# Patient Record
Sex: Female | Born: 1995
Health system: Southern US, Community
[De-identification: ages and names within clinical notes are randomized; demographics above are authoritative.]

## PROBLEM LIST (undated history)

## (undated) DIAGNOSIS — F32A Depression, unspecified: Secondary | ICD-10-CM

## (undated) DIAGNOSIS — F419 Anxiety disorder, unspecified: Secondary | ICD-10-CM

## (undated) HISTORY — DX: Depression, unspecified: F32.A

---
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
## 2017-01-26 IMAGING — NM NM HEPATOBILIARY IMAGE, INC GB
4 series · 14 of 14 positions shown · non-contrast
Comparison: Ultrasound 01/17/2016

CLINICAL DATA: Epigastric and right upper quadrant pain for 2 days.
Nausea, vomiting. Elevated white count and fever.

EXAM:
NUCLEAR MEDICINE HEPATOBILIARY IMAGING
TECHNIQUE: Sequential images of the abdomen were obtained [DATE] minutes
following intravenous administration of radiopharmaceutical.
RADIOPHARMACEUTICALS:  2.8 mCi Oc-88m  Choletec IV

[Series 1: biliary · 4.14mm/px · 6 of 60 frames shown]
[frame 6/60]
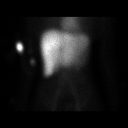
[frame 16/60]
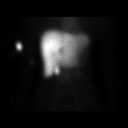
[frame 26/60]
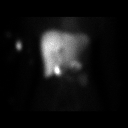
[frame 36/60]
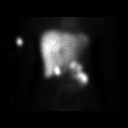
[frame 46/60]
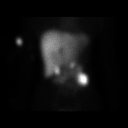
[frame 56/60]
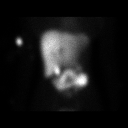

[Series 2: rt lat · 4.14mm/px · 1 of 1 slices shown (1 of 2)]
[im 1/1]
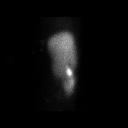

[Series 3: gbef · 4.14mm/px · 6 of 31 frames shown]
[frame 3/31]
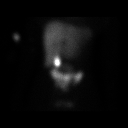
[frame 8/31]
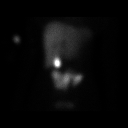
[frame 13/31]
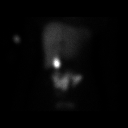
[frame 18/31]
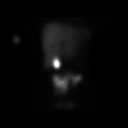
[frame 23/31]
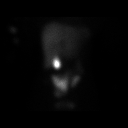
[frame 29/31]
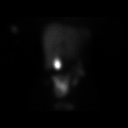

[Series 4: rt lat · 4.14mm/px · 1 of 1 slices shown (2 of 2)]
[im 1/1]
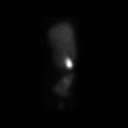

[14 of 14 positions shown; findings below may reference images not displayed]

FINDINGS: There is prompt uptake and excretion of radiotracer by the liver.
Gallbladder fills. No evidence of cystic duct or common bile duct
obstruction.
IMPRESSION: No evidence of biliary obstruction including no cystic duct
obstruction.

## 2017-02-14 DIAGNOSIS — F5001 Anorexia nervosa, restricting type: Secondary | ICD-10-CM | POA: Diagnosis not present

## 2017-02-25 DIAGNOSIS — F5001 Anorexia nervosa, restricting type: Secondary | ICD-10-CM | POA: Diagnosis not present

## 2017-03-04 DIAGNOSIS — F5001 Anorexia nervosa, restricting type: Secondary | ICD-10-CM | POA: Diagnosis not present

## 2017-03-11 DIAGNOSIS — F5001 Anorexia nervosa, restricting type: Secondary | ICD-10-CM | POA: Diagnosis not present

## 2017-03-18 DIAGNOSIS — F5001 Anorexia nervosa, restricting type: Secondary | ICD-10-CM | POA: Diagnosis not present

## 2017-03-25 DIAGNOSIS — F5001 Anorexia nervosa, restricting type: Secondary | ICD-10-CM | POA: Diagnosis not present

## 2017-04-01 DIAGNOSIS — F5001 Anorexia nervosa, restricting type: Secondary | ICD-10-CM | POA: Diagnosis not present

## 2017-04-29 DIAGNOSIS — F5001 Anorexia nervosa, restricting type: Secondary | ICD-10-CM | POA: Diagnosis not present

## 2017-05-06 DIAGNOSIS — Z01419 Encounter for gynecological examination (general) (routine) without abnormal findings: Secondary | ICD-10-CM | POA: Diagnosis not present

## 2017-05-06 DIAGNOSIS — Z681 Body mass index (BMI) 19 or less, adult: Secondary | ICD-10-CM | POA: Diagnosis not present

## 2017-05-19 DIAGNOSIS — F5001 Anorexia nervosa, restricting type: Secondary | ICD-10-CM | POA: Diagnosis not present

## 2017-05-27 ENCOUNTER — Ambulatory Visit: Payer: Self-pay | Admitting: Registered"

## 2017-06-03 DIAGNOSIS — F5001 Anorexia nervosa, restricting type: Secondary | ICD-10-CM | POA: Diagnosis not present

## 2017-06-09 ENCOUNTER — Encounter: Payer: Federal, State, Local not specified - PPO | Attending: Obstetrics and Gynecology | Admitting: *Deleted

## 2017-06-09 ENCOUNTER — Encounter: Payer: Self-pay | Admitting: *Deleted

## 2017-06-09 ENCOUNTER — Ambulatory Visit: Payer: Self-pay | Admitting: Registered"

## 2017-06-09 DIAGNOSIS — E46 Unspecified protein-calorie malnutrition: Secondary | ICD-10-CM

## 2017-06-09 DIAGNOSIS — Z713 Dietary counseling and surveillance: Secondary | ICD-10-CM | POA: Diagnosis not present

## 2017-06-09 DIAGNOSIS — R636 Underweight: Secondary | ICD-10-CM | POA: Insufficient documentation

## 2017-06-09 DIAGNOSIS — F509 Eating disorder, unspecified: Secondary | ICD-10-CM

## 2017-06-09 NOTE — Patient Instructions (Addendum)
Aim for 3 meals 2 Boost/Ensure of Carnation Breakfast Essentials Try Lactaid milk or soy milk And 1 snack each day

## 2017-06-09 NOTE — Progress Notes (Addendum)
Appointment start time:1600  Appointment end time: 1645  Patient was seen on 06/09/17 for nutrition counseling pertaining to disordered eating.  She is accompanied by her mother  [facility] Therapist:Sarah Dehart Con[facility] for a couple months.  That's going well.   Assessment:   Amanda Conley is well known to this provider as she was treated for disordered eating and malnutrition a year ago.  She went to college against medical advice and was not seen for a year.  She was referred today by her GYN for concerns about her weight.  Has lost weight since last year (12 lb down).  School is stressful and she doesn't eat when she is stressed.  Stress is somewhat better over the summer, but school will start back in 1 month.  She is seeing [name], but stopped taking her anxiety medication.    Is eating more than she was 3 months ago Weight is 74% mBMI/age which is hospital admission criteria  EAT-26 score: 14  No N/V. No GI distress No headaches No dizziness Positive for cold intolerance Ok energy Sleeping well Good mood, but anxious   Growth Metrics: Ideal BMI for age: 83.5  BMI today: 15.89  % Ideal today: 74  % Previous growth data: largely unavailable.  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  As evidenced by BMI/age <5th%.  Intervention/Goals:  Nutrition counseling provided. [name] agreed to follow up with adolescent medicine for medication  management support. Appointment scheduled for next week.  She does not want to go back to [facility].  She has already started increasing her food intake but it is not enough. Agreed to add 2 Boost/day and 1 glass Lactaid milk.   Family denies any questions or concerns.  Mom hopes she can get mental health support    Monitoring and Evaluation: Patient will follow up in 1weeks.

## 2017-06-16 ENCOUNTER — Encounter: Payer: Federal, State, Local not specified - PPO | Admitting: *Deleted

## 2017-06-16 DIAGNOSIS — R636 Underweight: Secondary | ICD-10-CM | POA: Diagnosis not present

## 2017-06-16 DIAGNOSIS — F5001 Anorexia nervosa, restricting type: Secondary | ICD-10-CM | POA: Diagnosis not present

## 2017-06-16 DIAGNOSIS — Z713 Dietary counseling and surveillance: Secondary | ICD-10-CM | POA: Diagnosis not present

## 2017-06-16 DIAGNOSIS — F509 Eating disorder, unspecified: Secondary | ICD-10-CM

## 2017-06-16 DIAGNOSIS — E46 Unspecified protein-calorie malnutrition: Secondary | ICD-10-CM

## 2017-06-16 NOTE — Patient Instructions (Signed)
3 meals, 2 snacks, 2 Boost Read Life Without Ed

## 2017-06-16 NOTE — Progress Notes (Signed)
Appointment start time:1130  Appointment end time: 1215  Patient was seen on 06/16/17 for nutrition counseling pertaining to disordered eating.  She is accompanied by her mother  [facility] Therapist:Sarah Dehart Young    Assessment:   Some lightheadedness in the mornings most days Low energy Sleeping well  Weight is down  Feels more tired and thinks it's because she is eating more?  It's been hard to eat more.  Thinks she is still eating too little.  Does get hungry and eats something.  It's hard managing her schedule with eating.   Eating more is causing more anxiety.  Being around people eating is causing anxiety.  Feels its drawing more attention to herself. Weight is down.  No GI distress  Misses dinner regularly   Weight is 74% mBMI/age which is hospital admission criteria Mom reports 3 panic attacks and couldn't breathe   Growth Metrics: Ideal BMI for age: 71.5  BMI today: 15.  % Ideal today: 73  % Previous growth data: largely unavailable.  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  As evidenced by BMI/age <5th%.  Intervention/Goals:  Nutrition counseling provided.  Need 3 meals, 2 snacks, and 2 Boost/day.  Read Life Without Ed. She is close to needing hospitalization.  Challenged Ed    Monitoring and Evaluation: Patient will follow up in 2 weeks.

## 2017-06-17 ENCOUNTER — Encounter: Payer: Self-pay | Admitting: Family

## 2017-06-17 ENCOUNTER — Ambulatory Visit (INDEPENDENT_AMBULATORY_CARE_PROVIDER_SITE_OTHER): Payer: Federal, State, Local not specified - PPO | Admitting: Family

## 2017-06-17 VITALS — BP 103/78 | HR 103 | Ht 63.78 in | Wt 90.8 lb

## 2017-06-17 DIAGNOSIS — Z1389 Encounter for screening for other disorder: Secondary | ICD-10-CM | POA: Diagnosis not present

## 2017-06-17 DIAGNOSIS — F509 Eating disorder, unspecified: Secondary | ICD-10-CM

## 2017-06-17 LAB — POCT URINALYSIS DIPSTICK
Bilirubin, UA: NEGATIVE
Blood, UA: POSITIVE
GLUCOSE UA: NEGATIVE
Ketones, UA: NEGATIVE
NITRITE UA: NEGATIVE
SPEC GRAV UA: 1.015 (ref 1.010–1.025)
UROBILINOGEN UA: 0.2 U/dL
pH, UA: 5 (ref 5.0–8.0)

## 2017-06-17 NOTE — Patient Instructions (Addendum)
Today we are obtaining bloodwork and an EKG. We will let you know about these lab results.  Here is the link for the [facility]. Look this over and at the next visit we can discuss. [email or web address]  [email or web address]

## 2017-06-17 NOTE — Progress Notes (Signed)
THIS RECORD MAY CONTAIN CONFIDENTIAL INFORMATION THAT SHOULD NOT BE RELEASED WITHOUT REVIEW OF THE SERVICE PROVIDER.  Adolescent Medicine Consultation Follow-Up Visit Amanda Conley  is a 21 y.o. female referred by Amanda Conley here today for follow-up regarding disordered eating.    Last seen in Adolescent Medicine Clinic on 05/14/16 for disordered eating follow up.  Plan at last visit included continue remeron. After that visit [facility] of [facility] disordered eating.  Pertinent Labs? No Growth Chart Viewed? yes   History was provided by the patient and mother.  Interpreter? no  Chief Complaint  Patient presents with  . Follow-up  . Eating Disorder    HPI:  Amanda Conley was last seen in adolescent clinic a year ago. As mentioned above, [facility] summer for inpatient treatment of her disordered eating. She gained weight there but overall her mother was not satisfied with her inpatient stay because she felt like "all they did was feed her" and that her underlying causes of her eating disorder was not addressed.  She started back at school in August (had been going [employer] but transferred to [facility]). Shortly after starting back to school she started losing weight again. About a month ago she went to the OBGYN for a check up and they referred her back to nutrition. She saw nutrition yesterday who noted that her weight is 74% mBMI/age which would be hospital admission criteria if she were <18.  She has been seeing a therapist in [place] every other week. She continues to have significant anxiety around eating. States that when she eats in front of other people she is very self conscious and feels like people judge her based on what she eats. Reports getting three panic attacks in the past week in which she became lightheaded and short of breath. Denies purging. Is still having periods, is on her period today.  She  is not on any of the medications she was taking last year. A provider at [facility] has prescribed her aripiprazole and buspar, which are the only medications she is taking.  [place] not interested in [place] back [facility] for the reasons stated above, but she and [facility] both acknowledge that she likely will need a more intense level of care than outpatient.  24 hour recall: In past day she ate 2 boost shakes and 1 slice of pizza  Patient's last menstrual period was 05/27/2017 (exact date). No Known Allergies Outpatient Medications Prior to Visit  Medication Sig Dispense Refill  . acetaminophen (TYLENOL) 325 MG tablet Take 325-650 mg by mouth 2 (two) times daily as needed for moderate pain or headache. Reported on 05/14/2016    . cholecalciferol 400 units tablet Take 2 tablets (800 Units total) by mouth daily. (Patient not taking: Reported on 06/09/2017) 30 each 0  . famotidine (PEPCID) 20 MG tablet Take 1 tablet (20 mg total) by mouth 2 (two) times daily after a meal. (Patient not taking: Reported on 06/09/2017) 60 tablet 2  . Fluoxetine HCl, PMDD, 10 MG CAPS Take 1 capsule (10 mg total) by mouth daily. (Patient not taking: Reported on 06/09/2017) 30 each 0  . LORazepam (ATIVAN) 0.5 MG tablet Take 0.5 tablets (0.25 mg total) by mouth 3 (three) times daily before meals. (Patient not taking: Reported on 06/09/2017) 30 tablet 0  . mirtazapine (REMERON) 15 MG tablet Take 1 tablet (15 mg total) by mouth at bedtime. (Patient not taking:  Reported on 06/09/2017) 30 tablet 0  . Multiple Vitamins-Iron (MULTIVITAMINS WITH IRON) TABS tablet Take 1 tablet by mouth daily. 30 tablet 2  . OLANZapine (ZYPREXA) 5 MG tablet Take 1 tablet (5 mg total) by mouth at bedtime. (Patient not taking: Reported on 06/09/2017) 30 tablet 1   No facility-administered medications prior to visit.      Patient Active Problem List   Diagnosis Date Noted  . Adjustment disorder with mixed anxiety and depressed mood 02/19/2016  .  Eating disorder   . Malnutrition compromising bodily function (HCC)   . DRESS syndrome 01/21/2016   Sometimes SI no plan   Confidentiality was discussed with the patient and if applicable, with caregiver as well.  Gender identity: bisexual Sex assigned at birth: female Pronouns:she Tobacco?  no Drugs/ETOH?  no Partner preference?  both  Sexually Active?  yes  Pregnancy Prevention:  birth control pills Reviewed condoms:  yes  Suicidal or homicidal thoughts? Sometimes has SI but no plan Self injurious behaviors? no but in the past has tried to overdose with pills Guns in the home?  no  Physical Exam:  Vitals:   06/17/17 1348 06/17/17 1428 06/17/17 1429 06/17/17 1431  BP: 111/75 101/70 105/74 103/78  Pulse: (!) 109 84 94 (!) 103  Weight: 90 lb 12.8 oz (41.2 kg)     Height: 5' 3.78" (1.62 m)      BP 103/78 (Patient Position: Standing)   Pulse (!) 103   Ht 5' 3.78" (1.62 m)   Wt 90 lb 12.8 oz (41.2 kg)   LMP 05/27/2017 (Exact Date)   BMI 15.69 kg/m  Body mass index: body mass index is 15.69 kg/m. Blood pressure percentiles are 44 % systolic and 85 % diastolic based on the August 2017 AAP Clinical Practice Guideline. Blood pressure percentile targets: 90: 126/78, 95: 129/81, 95 + 12 mmHg: 141/93.  Physical Exam  Constitutional: She is oriented to person, place, and time.  Thin female, not in distress  HENT:  Head: Normocephalic and atraumatic.  Nose: Nose normal.  Mouth/Throat: Oropharynx is clear and moist.  Eyes: Pupils are equal, round, and reactive to light. Conjunctivae and EOM are normal.  Neck: Normal range of motion. Neck supple.  Cardiovascular: Normal rate, regular rhythm and normal heart sounds.   No murmur heard. Pulmonary/Chest: Effort normal and breath sounds normal. She has no wheezes.  Abdominal: Soft. Bowel sounds are normal. She exhibits no distension. There is no tenderness.  Musculoskeletal: Normal range of motion.  Lymphadenopathy:    She has no  cervical adenopathy.  Neurological: She is alert and oriented to person, place, and time.  Skin: Skin is warm and dry.    Assessment/Plan: 1. Disordered eating - As above, discussed with Jahzaria and mom that she likely will require inpatient management of her disordered eating. Agree with mother that it would be more beneficial if we found a program that fit her needs in addressing the underlying issues driving this behavior.  - Provided information on [facility]. Asked Alonie and mother to look this over; will bring Tanasia back next week and discuss this as an option then. - Comprehensive metabolic panel - EKG 12-Lead  2. Screening for genitourinary condition - POCT urinalysis dipstick    Follow-up:  Return in about 1 week (around 06/24/2017) for DE follow up.   Medical decision-making:  >15 minutes spent face to face with patient with more than 50% of appointment spent discussing diagnosis, management, follow-up, and reviewing of the above  conditions and concerns.  Randolm IdolSarah Benjamim Harnish, [facility], PGY-2

## 2017-06-18 LAB — COMPREHENSIVE METABOLIC PANEL
ALBUMIN: 4.6 g/dL (ref 3.6–5.1)
ALK PHOS: 62 U/L (ref 47–176)
ALT: 13 U/L (ref 5–32)
AST: 15 U/L (ref 12–32)
BUN: 18 mg/dL (ref 7–20)
CALCIUM: 9.6 mg/dL (ref 8.9–10.4)
CO2: 18 mmol/L — ABNORMAL LOW (ref 20–31)
Chloride: 105 mmol/L (ref 98–110)
Creat: 0.86 mg/dL (ref 0.50–1.00)
GLUCOSE: 65 mg/dL (ref 65–99)
Potassium: 4.4 mmol/L (ref 3.8–5.1)
Sodium: 139 mmol/L (ref 135–146)
TOTAL PROTEIN: 7.3 g/dL (ref 6.3–8.2)
Total Bilirubin: 1.2 mg/dL — ABNORMAL HIGH (ref 0.2–1.1)

## 2017-06-20 ENCOUNTER — Ambulatory Visit (HOSPITAL_COMMUNITY)
Admission: RE | Admit: 2017-06-20 | Discharge: 2017-06-20 | Disposition: A | Discharge status: Home / Self Care | Payer: Federal, State, Local not specified - PPO | Source: Ambulatory Visit | Attending: Family | Admitting: Family

## 2017-06-20 ENCOUNTER — Telehealth: Payer: Self-pay

## 2017-06-20 DIAGNOSIS — I498 Other specified cardiac arrhythmias: Secondary | ICD-10-CM | POA: Diagnosis not present

## 2017-06-20 DIAGNOSIS — F509 Eating disorder, unspecified: Secondary | ICD-10-CM | POA: Insufficient documentation

## 2017-06-20 NOTE — Telephone Encounter (Signed)
-----   Message from Christianne Dolinhristy Millican, NP sent at 06/20/2017 12:36 PM EDT ----- Labs and EKG normal. Please notify pt.

## 2017-06-20 NOTE — Telephone Encounter (Signed)
Spoke with patient and let her know lab results and EKG came back normal. Reminded patient of her appointment 08/03. Patient states understanding and did not have any further questions.

## 2017-06-24 ENCOUNTER — Ambulatory Visit: Payer: Federal, State, Local not specified - PPO | Admitting: Family

## 2017-06-28 ENCOUNTER — Encounter: Payer: Self-pay | Admitting: Family

## 2017-06-28 ENCOUNTER — Other Ambulatory Visit: Payer: Self-pay | Admitting: Family

## 2017-06-28 ENCOUNTER — Ambulatory Visit (INDEPENDENT_AMBULATORY_CARE_PROVIDER_SITE_OTHER): Payer: Federal, State, Local not specified - PPO | Admitting: Family

## 2017-06-28 VITALS — BP 107/75 | HR 101 | Ht 63.98 in | Wt 93.0 lb

## 2017-06-28 DIAGNOSIS — Z1389 Encounter for screening for other disorder: Secondary | ICD-10-CM | POA: Diagnosis not present

## 2017-06-28 DIAGNOSIS — F509 Eating disorder, unspecified: Secondary | ICD-10-CM

## 2017-06-28 DIAGNOSIS — F4323 Adjustment disorder with mixed anxiety and depressed mood: Secondary | ICD-10-CM | POA: Diagnosis not present

## 2017-06-28 DIAGNOSIS — R8290 Unspecified abnormal findings in urine: Secondary | ICD-10-CM | POA: Diagnosis not present

## 2017-06-28 DIAGNOSIS — E46 Unspecified protein-calorie malnutrition: Secondary | ICD-10-CM | POA: Diagnosis not present

## 2017-06-28 LAB — POCT URINALYSIS DIPSTICK
BILIRUBIN UA: NEGATIVE
Blood, UA: NEGATIVE
Glucose, UA: NEGATIVE
KETONES UA: NEGATIVE
NITRITE UA: NEGATIVE
PROTEIN UA: NEGATIVE
Spec Grav, UA: 1.02 (ref 1.010–1.025)
Urobilinogen, UA: NEGATIVE E.U./dL — AB
pH, UA: 6 (ref 5.0–8.0)

## 2017-06-28 MED ORDER — MIRTAZAPINE 15 MG PO TABS: 15.0000 mg | ORAL_TABLET | Freq: Every day | ORAL | 0 refills | Status: DC

## 2017-06-28 NOTE — Patient Instructions (Signed)
Start taking Remeron 15 mg nightly.  Return in 3 weeks. Your appointment is scheduled.  Call or return if new or worsening symptoms or unexpected side effects from the medication.  Please call if you need admission information for [facility].

## 2017-06-28 NOTE — Progress Notes (Signed)
THIS RECORD MAY CONTAIN CONFIDENTIAL INFORMATION THAT SHOULD NOT BE RELEASED WITHOUT REVIEW OF THE SERVICE PROVIDER.  Adolescent Medicine Consultation Follow-Up Visit Amanda [facility]ley  is a 21 y.o. female referred by Almon Hercules, MD here today for follow-up regarding DE.    Last seen in Adolescent Medicine Clinic on 06/17/17 for labs, EKG; discussion re: higher level of care [facility] to review.  Pertinent Labs? No Growth Chart Viewed? no   History was provided by the patient.  Interpreter? no  PCP Confirmed?  yes  [name]?   no    Chief Complaint  Patient presents with  . Follow-up  . Eating Disorder    HPI:   Presents with mom.  Visited [address]; looking at [employer] and forms now. Interested in medication, having increased anxiety. Still seeing therapy every 2 weeks. Denies SI/HI.   Review of Systems  Constitutional: Negative for malaise/fatigue.  Eyes: Negative for double vision.  Respiratory: Negative for shortness of breath.   Cardiovascular: Negative for chest pain and palpitations.  Gastrointestinal: Negative for abdominal pain, constipation, diarrhea, nausea and vomiting.  Genitourinary: Negative for dysuria.  Musculoskeletal: Negative for joint pain and myalgias.  Skin: Negative for rash.  Neurological: Negative for dizziness and headaches.  Endo/Heme/Allergies: Does not bruise/bleed easily.  Psychiatric/Behavioral: Negative for suicidal ideas. The patient is nervous/anxious.    No LMP recorded. No Known Allergies Outpatient Medications Prior to Visit  Medication Sig Dispense Refill  . acetaminophen (TYLENOL) 325 MG tablet Take 325-650 mg by mouth 2 (two) times daily as needed for moderate pain or headache. Reported on 05/14/2016    . cholecalciferol 400 units tablet Take 2 tablets (800 Units total) by mouth daily. (Patient not taking: Reported on 06/09/2017) 30 each 0  . famotidine (PEPCID) 20 MG tablet Take 1 tablet (20 mg total) by  mouth 2 (two) times daily after a meal. (Patient not taking: Reported on 06/09/2017) 60 tablet 2  . Fluoxetine HCl, PMDD, 10 MG CAPS Take 1 capsule (10 mg total) by mouth daily. (Patient not taking: Reported on 06/09/2017) 30 each 0  . LORazepam (ATIVAN) 0.5 MG tablet Take 0.5 tablets (0.25 mg total) by mouth 3 (three) times daily before meals. (Patient not taking: Reported on 06/09/2017) 30 tablet 0  . mirtazapine (REMERON) 15 MG tablet Take 1 tablet (15 mg total) by mouth at bedtime. (Patient not taking: Reported on 06/09/2017) 30 tablet 0  . Multiple Vitamins-Iron (MULTIVITAMINS WITH IRON) TABS tablet Take 1 tablet by mouth daily. (Patient not taking: Reported on 06/28/2017) 30 tablet 2  . OLANZapine (ZYPREXA) 5 MG tablet Take 1 tablet (5 mg total) by mouth at bedtime. (Patient not taking: Reported on 06/09/2017) 30 tablet 1   No facility-administered medications prior to visit.      Patient Active Problem List   Diagnosis Date Noted  . Adjustment disorder with mixed anxiety and depressed mood 02/19/2016  . Eating disorder   . Malnutrition compromising bodily function (HCC)   . DRESS syndrome 01/21/2016    The following portions of the patient's history were reviewed and updated as appropriate: allergies, current medications, past medical history and past social history.  Physical Exam:  Vitals:   06/28/17 1406 06/28/17 1422  BP: 101/68 107/75  Pulse: 97 (!) 101  Weight: 93 lb (42.2 kg)   Height: 5' 3.98" (1.625 m)    Ht 5' 3.98" (1.625 m)   Wt 93 lb (42.2 kg)   BMI 15.98 kg/m  Body mass  index: body mass index is 15.98 kg/m. Blood pressure percentiles are 8 % systolic and 60 % diastolic based on the August 2017 AAP Clinical Practice Guideline. Blood pressure percentile targets: 90: 126/78, 95: 129/81, 95 + 12 mmHg: 141/93.  Wt Readings from Last 3 Encounters:  06/28/17 93 lb (42.2 kg) (<1 %, Z= -2.48)*  06/17/17 90 lb 12.8 oz (41.2 kg) (<1 %, Z= -2.73)*  06/16/17 91 lb 6.4 oz (41.5  kg) (<1 %, Z= -2.66)*   * Growth percentiles are based on CDC 2-20 Years data.   Growth Metrics: Ideal BMI for age:65.5 BMI today: 15.98% Ideal today:74% Previous growth data: largely unavailable. 3 data plots since age 21, nothing prior to age 21 weight/age <5th; height/age at 25th%; goal is for BMI at 25% = 19.4 Goal rate of weight gain:0.5-1.0 lb/week  Physical Exam  HENT:  Mouth/Throat: No oropharyngeal exudate.  Eyes:  Corrective lenses  Neck: No thyromegaly present.  Cardiovascular: Normal rate.   No murmur heard. Pulmonary/Chest: Effort normal.  Musculoskeletal: Normal range of motion.  Lymphadenopathy:    She has no cervical adenopathy.  Neurological: She is alert.  Skin:  Cool extremities   Psychiatric: Her mood appears anxious.    Assessment/Plan: 1. Malnutrition compromising bodily function (HCC) -weight up slightly; still struggling.  -will continue to see higher level of care; unclear timeline -  2. Eating disorder -as above  -start remeron 15 mg  -  3. Adjustment disorder with mixed anxiety and depressed mood -added remeron -continue with tx team -need timeline for higher level of care   4. Abnormal urine findings -mod leuks, asymptomatic, will culture - Urine Culture  5. Screening for genitourinary condition -mod leuks, asymptomatic; will culture  - POCT urinalysis dipstick   Follow-up:  Will call for next follow-up appt. Would need to see in 3 weeks after start of medication.    Medical decision-making:  >25 minutes spent face to face with patient with more than 50% of appointment spent discussing medication management of symptoms, new meds and doses, return precautions, and follow-up care.

## 2017-06-29 ENCOUNTER — Encounter: Payer: Federal, State, Local not specified - PPO | Attending: Obstetrics and Gynecology | Admitting: *Deleted

## 2017-06-29 DIAGNOSIS — R636 Underweight: Secondary | ICD-10-CM | POA: Insufficient documentation

## 2017-06-29 DIAGNOSIS — E46 Unspecified protein-calorie malnutrition: Secondary | ICD-10-CM

## 2017-06-29 DIAGNOSIS — Z713 Dietary counseling and surveillance: Secondary | ICD-10-CM | POA: Insufficient documentation

## 2017-06-29 DIAGNOSIS — F509 Eating disorder, unspecified: Secondary | ICD-10-CM

## 2017-06-29 LAB — URINE CULTURE: ORGANISM ID, BACTERIA: NO GROWTH

## 2017-06-29 NOTE — Progress Notes (Signed)
appt start time 1630  appt end time1700  Assessment: Amanda [facility] is here for follow up nutrition counseling pertaining to disordered eating   [facility] and took a tour earlier this summer.  Is checking insurance, but not planning on going until the spring because of school.  Weight is up somewhat More panic attacks and changing to Remeron.  Waiting for her at the pharmacy. Having trouble with sleeping.  Not sure why she is having panic attacks.  Sees therapist every 2 weeks.  Thinks that is sufficient.   No N/V. no reflux.  Dizziness every day.  No headaches.  Low energy level.  Normal BM  Growth Metrics: Ideal BMI for age: 13.5  BMI today: 15. 98            % Ideal today: 74  % Previous growth data: largely unavailable.  3 data plots since age 21, nothing prior to age 21              weight/age  <5th; height/age at 25th%; goal is for BMI at 25% = 19.4 Goal rate of weight gain: 0.5-1.0 lb/week    Eating is going better.  3 meals and some snacks.  That is difficult.  Feeling full.  Manageable. Not terrible 24 hour recall B: Danish, juice, water L: cheeseburger, carrots, V8 juice D: burger and fries, water Boost No snacks  Nutrition Diagnosis: NB-1.5 Disordered eating pattern As related to history of inadequate intake.  As evidenced by BMI/age <5th%.  Intervention: Will put reminder on her phone to get snacks in 3/day Breakfast: 2 slices toast with butter, 2 eggs scrambled in butter, Lactaid whole milk, apple Muffins with butter, 2 slices bacon, yogurt, apple juice Danish, sausage, string cheese, orange juice  Snack: Boost  Lunch: grilled cheese sandwich, lactaid milk, carrots [place] and cheese sandwich with mayo and mustard, string cheese, cranberry juice Ham and cheese sandwich with mayo and mustard, yogurt, cucumbers  Snack:  yogurt and granola Cheese and crackers  Dinner: whatever family has Pasta with meat sauce with celery Spaghetti with meat sauce,  cheese, salad  Snack: celery and Peanut butter Apple and peanut butter   Monitoring Follow up in 2 weeks

## 2017-06-30 DIAGNOSIS — F5001 Anorexia nervosa, restricting type: Secondary | ICD-10-CM | POA: Diagnosis not present

## 2017-07-14 DIAGNOSIS — F5001 Anorexia nervosa, restricting type: Secondary | ICD-10-CM | POA: Diagnosis not present

## 2017-07-22 ENCOUNTER — Encounter: Payer: Self-pay | Admitting: Family

## 2017-07-22 ENCOUNTER — Ambulatory Visit (INDEPENDENT_AMBULATORY_CARE_PROVIDER_SITE_OTHER): Payer: Federal, State, Local not specified - PPO | Admitting: Family

## 2017-07-22 VITALS — BP 111/77 | HR 107 | Ht 64.17 in | Wt 96.0 lb

## 2017-07-22 DIAGNOSIS — F4323 Adjustment disorder with mixed anxiety and depressed mood: Secondary | ICD-10-CM

## 2017-07-22 DIAGNOSIS — F509 Eating disorder, unspecified: Secondary | ICD-10-CM | POA: Diagnosis not present

## 2017-07-22 DIAGNOSIS — Z1389 Encounter for screening for other disorder: Secondary | ICD-10-CM

## 2017-07-22 LAB — POCT URINALYSIS DIPSTICK
Bilirubin, UA: NEGATIVE
Glucose, UA: NEGATIVE
KETONES UA: NEGATIVE
Leukocytes, UA: NEGATIVE
Nitrite, UA: NEGATIVE
PH UA: 5 (ref 5.0–8.0)
PROTEIN UA: NEGATIVE
RBC UA: NEGATIVE
SPEC GRAV UA: 1.02 (ref 1.010–1.025)
UROBILINOGEN UA: NEGATIVE U/dL — AB

## 2017-07-22 MED ORDER — MIRTAZAPINE 15 MG PO TABS
ORAL_TABLET | ORAL | 0 refills | Status: DC
Start: 2017-07-22 — End: 2017-08-31

## 2017-07-22 NOTE — Progress Notes (Signed)
THIS RECORD MAY CONTAIN CONFIDENTIAL INFORMATION THAT SHOULD NOT BE RELEASED WITHOUT REVIEW OF THE SERVICE PROVIDER.  Adolescent Medicine Consultation Follow-Up Visit Amanda Conley  is a 21 y.o. female referred by Almon Hercules, MD here today for follow-up regarding DE.    Last seen in [facility] on 06/28/17 for same.  Plan at last visit included Remeron 15 mg nightly initiation; continue with tx team with plan for [facility] admission.  Pertinent Labs? No Growth Chart Viewed? yes   History was provided by the patient and mother.  Interpreter? no  PCP Confirmed?  yes  [name]?   no    Chief Complaint  Patient presents with  . Follow-up  . Eating Disorder    HPI:    -Things improving with Remeron use.  -No missed doses. Taking about 8-9pm; sleeping 4-5 hrs each night which is improvement.  -No binges/purges/lax use.  -[employer] in [place]; still living in dorm.  Review of Systems  Constitutional: Negative for malaise/fatigue.  Eyes: Negative for double vision.  Respiratory: Negative for shortness of breath.   Cardiovascular: Negative for chest pain and palpitations.  Gastrointestinal: Negative for abdominal pain, constipation, diarrhea, nausea and vomiting.  Genitourinary: Negative for dysuria.  Musculoskeletal: Negative for joint pain and myalgias.  Skin: Negative for rash.  Neurological: Negative for dizziness and headaches.  Endo/Heme/Allergies: Does not bruise/bleed easily.     No LMP recorded. No Known Allergies Outpatient Medications Prior to Visit  Medication Sig Dispense Refill  . acetaminophen (TYLENOL) 325 MG tablet Take 325-650 mg by mouth 2 (two) times daily as needed for moderate pain or headache. Reported on 05/14/2016    . cholecalciferol 400 units tablet Take 2 tablets (800 Units total) by mouth daily. (Patient not taking: Reported on 06/09/2017) 30 each 0  . famotidine (PEPCID) 20 MG tablet Take 1 tablet (20 mg  total) by mouth 2 (two) times daily after a meal. (Patient not taking: Reported on 06/09/2017) 60 tablet 2  . Fluoxetine HCl, PMDD, 10 MG CAPS Take 1 capsule (10 mg total) by mouth daily. (Patient not taking: Reported on 06/09/2017) 30 each 0  . LORazepam (ATIVAN) 0.5 MG tablet Take 0.5 tablets (0.25 mg total) by mouth 3 (three) times daily before meals. (Patient not taking: Reported on 06/09/2017) 30 tablet 0  . mirtazapine (REMERON) 15 MG tablet TAKE 1 TABLET(15 MG) BY MOUTH AT BEDTIME 90 tablet 0  . Multiple Vitamins-Iron (MULTIVITAMINS WITH IRON) TABS tablet Take 1 tablet by mouth daily. (Patient not taking: Reported on 06/28/2017) 30 tablet 2  . OLANZapine (ZYPREXA) 5 MG tablet Take 1 tablet (5 mg total) by mouth at bedtime. (Patient not taking: Reported on 06/09/2017) 30 tablet 1   No facility-administered medications prior to visit.      Patient Active Problem List   Diagnosis Date Noted  . Adjustment disorder with mixed anxiety and depressed mood 02/19/2016  . Eating disorder   . Malnutrition compromising bodily function (HCC)   . DRESS syndrome 01/21/2016   Confidentiality was discussed with the patient and if applicable, with caregiver as well.   PHQ-SADS SCORE ONLY 07/22/2017 04/01/2016 03/12/2016  PHQ-15 6 9 12   GAD-7 1 8 6   PHQ-9 5 10 9   Suicidal Ideation No No Yes  Comment  somwhat difficult  somewhat difficult    PHQ-SADS SCORE ONLY 02/16/2016  PHQ-15 13  GAD-7 15  PHQ-9 19  Suicidal Ideation Yes  Comment "Somewhat difficult"     Physical Exam:  Vitals:   07/22/17 1401  BP: 99/65  Pulse: 80  Weight: 96 lb (43.5 kg)  Height: 5' 4.17" (1.63 m)   Ht 5' 4.17" (1.63 m)   Wt 96 lb (43.5 kg)   BMI 16.39 kg/m  Body mass index: body mass index is 16.39 kg/m. Blood pressure percentiles are 6 % systolic and 45 % diastolic based on the August 2017 AAP Clinical Practice Guideline. Blood pressure percentile targets: 90: 126/78, 95: 129/81, 95 + 12 mmHg: 141/93.  Wt Readings  from Last 3 Encounters:  07/22/17 96 lb (43.5 kg) (2 %, Z= -2.17)*  06/28/17 93 lb (42.2 kg) (<1 %, Z= -2.48)*  06/17/17 90 lb 12.8 oz (41.2 kg) (<1 %, Z= -2.73)*   * Growth percentiles are based on CDC 2-20 Years data.    Physical Exam  Constitutional: She is oriented to person, place, and time. No distress.  Long sleeves on very hot day   HENT:  Long, thick extensions worn today    Eyes: Pupils are equal, round, and reactive to light. EOM are normal.  Corrective lenses worn  Cardiovascular: Normal rate, regular rhythm, normal heart sounds and intact distal pulses.   No murmur heard. Pulmonary/Chest: Effort normal.  Abdominal: Soft.  Musculoskeletal: Normal range of motion. She exhibits no edema.  Lymphadenopathy:    She has no cervical adenopathy.  Neurological: She is alert and oriented to person, place, and time. No cranial nerve deficit.  Skin: Skin is warm and dry. No rash noted.  Psychiatric:  Calm, pleasantly interactive    Assessment/Plan: 1. Eating disorder -continue with Remeron.  -Return precautions give -Needs to reschedule with [name]  -Return in 4 weeks  - mirtazapine (REMERON) 15 MG tablet; TAKE 1 TABLET(15 MG) BY MOUTH AT BEDTIME  Dispense: 90 tablet; Refill: 0  2. Adjustment disorder with mixed anxiety and depressed mood -As above - mirtazapine (REMERON) 15 MG tablet; TAKE 1 TABLET(15 MG) BY MOUTH AT BEDTIME  Dispense: 90 tablet; Refill: 0  3. Screening for genitourinary condition WNL - POCT urinalysis dipstick   BH screenings: PHQSADS reviewed and indicated negative screening. Screens discussed with patient and parent and adjustments to plan made accordingly. EAT26 needed at next OV.  Follow-up:  Return for with any [facility] provider, medication follow-up, DE management.   Medical decision-making:  >25 minutes spent face to face with patient with more than 50% of appointment spent discussing diagnosis, management, follow-up, and reviewing of DE,  Remeron use and need for higher level of care in Spring or sooner if nutritional status does not continue to restore. Of note, her hair extensions are long and undoubtedly heavy. Continue to monitor next OV in context of weight.

## 2017-07-22 NOTE — Patient Instructions (Signed)
Continue taking Remeron 15 mg nightly.  Return in 4 weeks or sooner as needed.

## 2017-07-28 DIAGNOSIS — F5001 Anorexia nervosa, restricting type: Secondary | ICD-10-CM | POA: Diagnosis not present

## 2017-08-09 ENCOUNTER — Telehealth: Payer: Self-pay | Admitting: *Deleted

## 2017-08-09 NOTE — Telephone Encounter (Signed)
email from therapist:  "Hi Amanda Conley,  I couldn't remember if I had forwarded this to you so that we could speak about Amanda Conley.  I am very worried about her as she seems to be barely managing adequate nutrition in my opinion.  I really thinks she needs inpatient/residential care, but she and her family seem very noncommittal about it.  I have had several conversations with dad and mom and they have minimally explored [address] as she says she does not want to go to [place] again.  I am not sure there is much we can do to change this situation.  She has said she wants to go but her parents seem to think it would be better to wait until the semester or academic year is over, etc.  I had been pushing it all summer before classes started even.  I see her every 2 weeks or so and I know she told me last week she hadn't seen you lately and she was waiting for her mother to schedule an appointment.  Any thoughts?  Thanks!  Huntley Dec"        This patient did not reschedule with me.  Office will attempt to reschedule patient.  She is seeing Christianne Dolin on 9/26

## 2017-08-11 DIAGNOSIS — F5001 Anorexia nervosa, restricting type: Secondary | ICD-10-CM | POA: Diagnosis not present

## 2017-08-17 ENCOUNTER — Ambulatory Visit (INDEPENDENT_AMBULATORY_CARE_PROVIDER_SITE_OTHER): Payer: Federal, State, Local not specified - PPO | Admitting: Family

## 2017-08-17 ENCOUNTER — Encounter: Payer: Self-pay | Admitting: Family

## 2017-08-17 VITALS — BP 108/72 | HR 96 | Ht 64.57 in | Wt 97.4 lb

## 2017-08-17 DIAGNOSIS — Z1389 Encounter for screening for other disorder: Secondary | ICD-10-CM | POA: Diagnosis not present

## 2017-08-17 DIAGNOSIS — F509 Eating disorder, unspecified: Secondary | ICD-10-CM

## 2017-08-17 DIAGNOSIS — F4323 Adjustment disorder with mixed anxiety and depressed mood: Secondary | ICD-10-CM

## 2017-08-17 LAB — POCT URINALYSIS DIPSTICK
BILIRUBIN UA: NEGATIVE
Glucose, UA: NEGATIVE
KETONES UA: NEGATIVE
NITRITE UA: NEGATIVE
PH UA: 5 (ref 5.0–8.0)
PROTEIN UA: NEGATIVE
RBC UA: NEGATIVE
Spec Grav, UA: 1.02 (ref 1.010–1.025)
Urobilinogen, UA: NEGATIVE E.U./dL — AB

## 2017-08-17 NOTE — Progress Notes (Signed)
THIS RECORD MAY CONTAIN CONFIDENTIAL INFORMATION THAT SHOULD NOT BE RELEASED WITHOUT REVIEW OF THE SERVICE PROVIDER.  Adolescent Medicine Consultation Follow-Up Visit Amanda Conley  is a 21 y.o. female referred by Almon Hercules, MD here today for follow-up regarding DE.    Last seen in [facility] Clinic on 08/ 31/18 for same.  Plan at last visit included remeron 15 mg.  Pertinent Labs? No Growth Chart Viewed? no   History was provided by the patient.  Interpreter? no  PCP Confirmed?  yes  [name]?   yes    Chief Complaint  Patient presents with  . Follow-up  . Eating Disorder    HPI:   -unlimited eating plan at school  -makes some food in her dorm  -taking remeron nightly; no missed dosing -more talkative today; still endorsing difficulties but wants to finish semester.  -no SI/HI  Review of Systems  Constitutional: Negative for malaise/fatigue.  Eyes: Negative for double vision.  Respiratory: Negative for shortness of breath.   Cardiovascular: Negative for chest pain and palpitations.  Gastrointestinal: Negative for abdominal pain, constipation, diarrhea, nausea and vomiting.  Genitourinary: Negative for dysuria.  Musculoskeletal: Negative for joint pain and myalgias.  Skin: Negative for rash.  Neurological: Negative for dizziness and headaches.  Endo/Heme/Allergies: Does not bruise/bleed easily.     No LMP recorded. Allergies  Allergen Reactions  . Lamotrigine Rash   Outpatient Medications Prior to Visit  Medication Sig Dispense Refill  . mirtazapine (REMERON) 15 MG tablet TAKE 1 TABLET(15 MG) BY MOUTH AT BEDTIME 90 tablet 0  . acetaminophen (TYLENOL) 325 MG tablet Take 325-650 mg by mouth 2 (two) times daily as needed for moderate pain or headache. Reported on 05/14/2016    . cholecalciferol 400 units tablet Take 2 tablets (800 Units total) by mouth daily. (Patient not taking: Reported on 06/09/2017) 30 each 0  . famotidine (PEPCID) 20 MG  tablet Take 1 tablet (20 mg total) by mouth 2 (two) times daily after a meal. (Patient not taking: Reported on 06/09/2017) 60 tablet 2  . Fluoxetine HCl, PMDD, 10 MG CAPS Take 1 capsule (10 mg total) by mouth daily. (Patient not taking: Reported on 06/09/2017) 30 each 0  . LORazepam (ATIVAN) 0.5 MG tablet Take 0.5 tablets (0.25 mg total) by mouth 3 (three) times daily before meals. (Patient not taking: Reported on 06/09/2017) 30 tablet 0  . Multiple Vitamins-Iron (MULTIVITAMINS WITH IRON) TABS tablet Take 1 tablet by mouth daily. (Patient not taking: Reported on 07/22/2017) 30 tablet 2  . OLANZapine (ZYPREXA) 5 MG tablet Take 1 tablet (5 mg total) by mouth at bedtime. (Patient not taking: Reported on 06/09/2017) 30 tablet 1   No facility-administered medications prior to visit.      Patient Active Problem List   Diagnosis Date Noted  . Adjustment disorder with mixed anxiety and depressed mood 02/19/2016  . Eating disorder   . Malnutrition compromising bodily function (HCC)   . DRESS syndrome 01/21/2016    Social History:    PHQ-SADS SCORE ONLY 08/17/2017 07/22/2017 04/01/2016  PHQ-15 10 6 9   GAD-7 2 1 8   PHQ-9 6 5 10   Suicidal Ideation No No No  Comment not difficut at all   somwhat difficult    PHQ-SADS SCORE ONLY 03/12/2016 02/16/2016  PHQ-15 12 13   GAD-7 6 15   PHQ-9 9 19   Suicidal Ideation Yes Yes  Comment somewhat difficult  "Somewhat difficult"     Physical Exam:  Vitals:   08/17/17 1500  BP: 108/72  Pulse: 96  Weight: 97 lb 6.4 oz (44.2 kg)  Height: 5' 4.57" (1.64 m)   BP 108/72   Pulse 96   Ht 5' 4.57" (1.64 m)   Wt 97 lb 6.4 oz (44.2 kg)   BMI 16.43 kg/m  Body mass index: body mass index is 16.43 kg/m. Blood pressure percentiles are 30 % systolic and 76 % diastolic based on the August 2017 AAP Clinical Practice Guideline. Blood pressure percentile targets: 90: 127/78, 95: 129/81, 95 + 12 mmHg: 141/93.  Wt Readings from Last 3 Encounters:  08/17/17 97 lb 6.4 oz  (44.2 kg) (2 %, Z= -2.03)*  07/22/17 96 lb (43.5 kg) (2 %, Z= -2.17)*  06/28/17 93 lb (42.2 kg) (<1 %, Z= -2.48)*   * Growth percentiles are based on CDC 2-20 Years data.   Physical Exam  Constitutional: She is oriented to person, place, and time. She appears well-developed. No distress.  HENT:  Head: Normocephalic and atraumatic.  Eyes: Pupils are equal, round, and reactive to light. EOM are normal. No scleral icterus.  Neck: Normal range of motion. Neck supple. No thyromegaly present.  Cardiovascular: Normal rate, regular rhythm, normal heart sounds and intact distal pulses.   No murmur heard. Pulmonary/Chest: Effort normal and breath sounds normal.  Abdominal: Soft.  Musculoskeletal: Normal range of motion. She exhibits no edema.  Lymphadenopathy:    She has no cervical adenopathy.  Neurological: She is alert and oriented to person, place, and time. No cranial nerve deficit.  Skin: Skin is warm and dry. No rash noted.  Psychiatric: She has a normal mood and affect. Her behavior is normal. Judgment and thought content normal.  Vitals reviewed.   Assessment/Plan: 1. Eating disorder -weight continues to trend upward -does not endorse many symptomatic complaints in visit.  -she still needs higher level of care; monitor closely  -remeron continue  2. Adjustment disorder with mixed anxiety and depressed mood -as above  3. Screening for genitourinary condition -reviewed, not ketonic  - POCT urinalysis dipstick   Follow-up:  Return in about 2 weeks (around 08/31/2017) for with Christianne Dolin, FNP-C, medication follow-up, DE management.   Medical decision-making:  >15 minutes spent face to face with patient with more than 50% of appointment spent discussing diagnosis, management, follow-up, and reviewing of meal plan, medications, continuing with tx team.

## 2017-08-17 NOTE — Patient Instructions (Signed)
Schedule follow up with [name] today.  See you in two weeks.

## 2017-08-31 ENCOUNTER — Ambulatory Visit (INDEPENDENT_AMBULATORY_CARE_PROVIDER_SITE_OTHER): Payer: Federal, State, Local not specified - PPO | Admitting: Family

## 2017-08-31 ENCOUNTER — Encounter: Payer: Self-pay | Admitting: Family

## 2017-08-31 VITALS — BP 115/80 | HR 120 | Ht 64.17 in | Wt 97.2 lb

## 2017-08-31 DIAGNOSIS — F509 Eating disorder, unspecified: Secondary | ICD-10-CM

## 2017-08-31 DIAGNOSIS — F4323 Adjustment disorder with mixed anxiety and depressed mood: Secondary | ICD-10-CM

## 2017-08-31 DIAGNOSIS — Z1389 Encounter for screening for other disorder: Secondary | ICD-10-CM | POA: Diagnosis not present

## 2017-08-31 LAB — POCT URINALYSIS DIPSTICK
BILIRUBIN UA: NEGATIVE
GLUCOSE UA: NEGATIVE
KETONES UA: NEGATIVE
Leukocytes, UA: NEGATIVE
Nitrite, UA: NEGATIVE
PH UA: 7 (ref 5.0–8.0)
Protein, UA: NEGATIVE
RBC UA: NEGATIVE
Spec Grav, UA: 1.01 (ref 1.010–1.025)
Urobilinogen, UA: NEGATIVE E.U./dL — AB

## 2017-08-31 MED ORDER — MIRTAZAPINE 15 MG PO TABS: ORAL_TABLET | ORAL | 0 refills | Status: DC

## 2017-08-31 MED ORDER — FAMOTIDINE 20 MG PO TABS: 20.0000 mg | ORAL_TABLET | Freq: Two times a day (BID) | ORAL | 2 refills | Status: DC

## 2017-08-31 NOTE — Progress Notes (Signed)
THIS RECORD MAY CONTAIN CONFIDENTIAL INFORMATION THAT SHOULD NOT BE RELEASED WITHOUT REVIEW OF THE SERVICE PROVIDER.  Adolescent Medicine Consultation Follow-Up Visit Amanda Conley  is a 21 y.o. female referred by Almon Hercules, MD here today for follow-up regarding DE.    Last seen in [facility] Clinic on 08/17/17 for same.  Plan at last visit included remeron 15 mg.   Pertinent Labs? No Growth Chart Viewed? Yes   History was provided by the patient.  Interpreter? no  PCP Confirmed?  yes  [name]?   yes    Chief Complaint  Patient presents with  . Follow-up  . Eating Disorder    HPI:    -doing ok -school is going well -working at [employer]  -taking remeron; no missed doses -denies si/hi  Review of Systems  Constitutional: Negative for malaise/fatigue.  Eyes: Negative for double vision.  Respiratory: Negative for shortness of breath.   Cardiovascular: Negative for chest pain and palpitations.  Gastrointestinal: Negative for abdominal pain, constipation, diarrhea, nausea and vomiting.  Genitourinary: Negative for dysuria.  Musculoskeletal: Negative for joint pain and myalgias.  Skin: Negative for rash.  Neurological: Negative for dizziness and headaches.  Endo/Heme/Allergies: Does not bruise/bleed easily.  Psychiatric/Behavioral: The patient is nervous/anxious.    No LMP recorded. Allergies  Allergen Reactions  . Lamotrigine Rash   Outpatient Medications Prior to Visit  Medication Sig Dispense Refill  . acetaminophen (TYLENOL) 325 MG tablet Take 325-650 mg by mouth 2 (two) times daily as needed for moderate pain or headache. Reported on 05/14/2016    . cholecalciferol 400 units tablet Take 2 tablets (800 Units total) by mouth daily. (Patient not taking: Reported on 06/09/2017) 30 each 0  . famotidine (PEPCID) 20 MG tablet Take 1 tablet (20 mg total) by mouth 2 (two) times daily after a meal. (Patient not taking: Reported on 06/09/2017) 60 tablet  2  . Fluoxetine HCl, PMDD, 10 MG CAPS Take 1 capsule (10 mg total) by mouth daily. (Patient not taking: Reported on 06/09/2017) 30 each 0  . LORazepam (ATIVAN) 0.5 MG tablet Take 0.5 tablets (0.25 mg total) by mouth 3 (three) times daily before meals. (Patient not taking: Reported on 06/09/2017) 30 tablet 0  . mirtazapine (REMERON) 15 MG tablet TAKE 1 TABLET(15 MG) BY MOUTH AT BEDTIME 90 tablet 0  . Multiple Vitamins-Iron (MULTIVITAMINS WITH IRON) TABS tablet Take 1 tablet by mouth daily. (Patient not taking: Reported on 07/22/2017) 30 tablet 2  . OLANZapine (ZYPREXA) 5 MG tablet Take 1 tablet (5 mg total) by mouth at bedtime. (Patient not taking: Reported on 06/09/2017) 30 tablet 1   No facility-administered medications prior to visit.      Patient Active Problem List   Diagnosis Date Noted  . Adjustment disorder with mixed anxiety and depressed mood 02/19/2016  . Eating disorder   . Malnutrition compromising bodily function (HCC)   . DRESS syndrome 01/21/2016    Social History:   Physical Exam:  Vitals:   08/31/17 1638 08/31/17 1650 08/31/17 1701  BP: 95/65 113/82 100/65  Pulse: 79 (!) 120 77  Weight: 97 lb 3.2 oz (44.1 kg)    Height: 5' 4.17" (1.63 m)     Ht 5' 4.17" (1.63 m)   Wt 97 lb 3.2 oz (44.1 kg)   BMI 16.59 kg/m  Body mass index: body mass index is 16.59 kg/m. No blood pressure reading on file for this encounter.  Wt Readings from Last 3 Encounters:  08/31/17 97 lb  3.2 oz (44.1 kg) (2 %, Z= -2.05)*  08/17/17 97 lb 6.4 oz (44.2 kg) (2 %, Z= -2.03)*  07/22/17 96 lb (43.5 kg) (2 %, Z= -2.17)*   * Growth percentiles are based on CDC 2-20 Years data.    Physical Exam  Constitutional: She is oriented to person, place, and time. She appears well-developed. No distress.  HENT:  Head: Normocephalic and atraumatic.  Eyes: Pupils are equal, round, and reactive to light. EOM are normal. No scleral icterus.  Neck: Normal range of motion. Neck supple. No thyromegaly  present.  Cardiovascular: Normal rate, regular rhythm, normal heart sounds and intact distal pulses.   No murmur heard. Pulmonary/Chest: Effort normal and breath sounds normal.  Abdominal: Soft.  Musculoskeletal: Normal range of motion. She exhibits no edema.  Lymphadenopathy:    She has no cervical adenopathy.  Neurological: She is alert and oriented to person, place, and time. No cranial nerve deficit.  Skin: Skin is dry. No rash noted.  Cool extremities   Psychiatric: Her mood appears anxious.    Assessment/Plan: 1. Eating disorder Weight stable Continue treatment team Continue with 15 mg Remeron nightly  - mirtazapine (REMERON) 15 MG tablet; TAKE 1 TABLET(15 MG) BY MOUTH AT BEDTIME  Dispense: 90 tablet; Refill: 0  2. Adjustment disorder with mixed anxiety and depressed mood As above - mirtazapine (REMERON) 15 MG tablet; TAKE 1 TABLET(15 MG) BY MOUTH AT BEDTIME  Dispense: 90 tablet; Refill: 0  3. Screening for genitourinary condition WNL - POCT urinalysis dipstick    Follow-up:  2 weeks    Medical decision-making:  >15 minutes spent face to face with patient with more than 50% of appointment spent discussing diagnosis, management, follow-up, and reviewing nutritional status, and tx team updates.

## 2017-08-31 NOTE — Patient Instructions (Signed)
Add Propel daily to water intake. Return next week; keep scheduled appointments.

## 2017-09-01 ENCOUNTER — Encounter: Payer: Federal, State, Local not specified - PPO | Attending: Obstetrics and Gynecology | Admitting: *Deleted

## 2017-09-01 DIAGNOSIS — Z713 Dietary counseling and surveillance: Secondary | ICD-10-CM | POA: Diagnosis not present

## 2017-09-01 DIAGNOSIS — F509 Eating disorder, unspecified: Secondary | ICD-10-CM

## 2017-09-01 DIAGNOSIS — E46 Unspecified protein-calorie malnutrition: Secondary | ICD-10-CM

## 2017-09-01 DIAGNOSIS — R636 Underweight: Secondary | ICD-10-CM | POA: Insufficient documentation

## 2017-09-01 NOTE — Patient Instructions (Addendum)
3 meals and 3 snacks Snacks like  Peanut butter with fruit  Cheese and crackers  Peanuts with dried fruit  Yogurt with granola  Granola bar with nuts or peanut butter or cheese stick  Boost  Eat all the candy you want, these snacks are in addition Please drink soy milk also with your meals, drink all the soda you want too  :-) Drink the Propels that Christy recommended  Take Remeron earlier so you're not to groggy Your hunger cues are normal!!!! Don't ignore them

## 2017-09-01 NOTE — Progress Notes (Signed)
appt start time 1400 appt end time 1430  Assessment: Amanda Conley is here for follow up nutrition counseling pertaining to disordered eating Saw christy yesterday.  haven't started propel   Sleeping "a lot".  Sleeping 10 hours/nigth and taking naps Low energy Sometimes headaches Dizziness with standing Positive for cold intolerance Nausea multiple ties a day No reflux.  Sometimes stomachaches No constipation No vision changes.    Eating more.  Thinks she's eating too much.  Eating in response    Growth Metrics: Ideal BMI for age: 67.5  BMI today:  16.59         % Ideal today: 74  % Previous growth data: largely unavailable.  3 data plots since age 21, nothing prior to age 59              weight/age  <5th; height/age at 25th%; goal is for BMI at 25% = 19.4 Goal rate of weight gain: 0.5-1.0 lb/week    Eating is going better.  3 meals and some snacks.   Safe foods: chicken tenders, pizza, fries and tots, cheeseburgers, pasta Fear foods: pickles  24 hour recall B: 4 mini waffles with syrup, cinnamon sugar and powerdered sugar; 4 slices bacon, coffee with sugar and cream S: candy (fruit), soda L: burger, waffles, fries, hot dog, soda Candy D: chicken tenders, fries, biscuit, soda S: candy Beverages: water   Exercise: walking around campus    Nutrition Diagnosis: NB-1.5 Disordered eating pattern As related to history of inadequate intake.  As evidenced by BMI/age <5th%.  Intervention: praised progress.  Weight was stable though so needs to increase by adding snacks 3 meals and 3 snacks/day.  Still needs higher level of care.  She wants to wait until spring  Monitoring Follow up in 2 weeks

## 2017-09-08 ENCOUNTER — Ambulatory Visit: Payer: Federal, State, Local not specified - PPO | Admitting: Pediatrics

## 2017-09-08 DIAGNOSIS — F5001 Anorexia nervosa, restricting type: Secondary | ICD-10-CM | POA: Diagnosis not present

## 2017-09-15 ENCOUNTER — Encounter: Payer: Federal, State, Local not specified - PPO | Admitting: *Deleted

## 2017-09-15 DIAGNOSIS — Z713 Dietary counseling and surveillance: Secondary | ICD-10-CM | POA: Diagnosis not present

## 2017-09-15 DIAGNOSIS — R636 Underweight: Secondary | ICD-10-CM | POA: Diagnosis not present

## 2017-09-15 DIAGNOSIS — E46 Unspecified protein-calorie malnutrition: Secondary | ICD-10-CM

## 2017-09-15 DIAGNOSIS — F509 Eating disorder, unspecified: Secondary | ICD-10-CM

## 2017-09-15 NOTE — Patient Instructions (Addendum)
Call [phone] to get set up for group [email or web address]   Get some Boost for your dormroom to have when you don't feel like eating

## 2017-09-15 NOTE — Progress Notes (Signed)
appt start time 1400 appt end time 1430  Assessment: Amanda Conley is here for follow up nutrition counseling pertaining to disordered eating More normal sleep and some naps.  Thinks that is adequate Still low energy no dizziness No headaches Nausea in the morning, goes away and comes back after eating sometimes. No vomiting Stomachaches 1/week.  3 out of 10.  Not related to food; possibly stress.  BM 1/day Still positive for cold intolerance No other concerns.   Appetite decreased and eating less.  Thinks it's stress related but appetite picking up  Stressed about school.  Sees therapist Verlan Friends(Sara Young) every 2 weeks.  Thinks that is enough.  Didn't know about eating disorder    Growth Metrics: Ideal BMI for age: 63.5  BMI today:  16         % Ideal today: 73  % Previous growth data: largely unavailable.  3 data plots since age 21, nothing prior to age 21              weight/age  <5th; height/age at 25th%; goal is for BMI at 25% = 19.4 Goal rate of weight gain: 0.5-1.0 lb/week    Eating is going better.  2 meals and some snacks.   Safe foods: chicken tenders, pizza, fries and tots, cheeseburgers, pasta Fear foods: pickles  24 hour recall B: none Boost L [place] burger with fries, tea, water Boost D: burger with fries, sprite, water Boost   Exercise: walking around campus    Nutrition Diagnosis: NB-1.5 Disordered eating pattern As related to history of inadequate intake.  As evidenced by BMI/age <5th%.  Intervention: praised progress.  Nees to increase by adding snacks 3 meals and 3 snacks/day.  Still needs higher level of care.  Discussed this with her and mom who agreed.  Will try to make it to end of semester, but might need to go sooner than that  Monitoring Follow up in 2 weeks

## 2017-09-21 DIAGNOSIS — F5001 Anorexia nervosa, restricting type: Secondary | ICD-10-CM | POA: Diagnosis not present

## 2017-09-22 ENCOUNTER — Encounter: Payer: Self-pay | Admitting: Pediatrics

## 2017-09-22 ENCOUNTER — Ambulatory Visit (INDEPENDENT_AMBULATORY_CARE_PROVIDER_SITE_OTHER): Payer: Federal, State, Local not specified - PPO | Admitting: Pediatrics

## 2017-09-22 ENCOUNTER — Encounter: Payer: Self-pay | Admitting: Student

## 2017-09-22 VITALS — BP 109/78 | HR 116 | Ht 64.17 in | Wt 93.4 lb

## 2017-09-22 DIAGNOSIS — F509 Eating disorder, unspecified: Secondary | ICD-10-CM | POA: Diagnosis not present

## 2017-09-22 DIAGNOSIS — E46 Unspecified protein-calorie malnutrition: Secondary | ICD-10-CM | POA: Diagnosis not present

## 2017-09-22 DIAGNOSIS — Z113 Encounter for screening for infections with a predominantly sexual mode of transmission: Secondary | ICD-10-CM

## 2017-09-22 DIAGNOSIS — Z1389 Encounter for screening for other disorder: Secondary | ICD-10-CM | POA: Diagnosis not present

## 2017-09-22 DIAGNOSIS — F4323 Adjustment disorder with mixed anxiety and depressed mood: Secondary | ICD-10-CM | POA: Diagnosis not present

## 2017-09-22 LAB — POCT URINALYSIS DIPSTICK
Bilirubin, UA: NEGATIVE
Glucose, UA: NEGATIVE
Ketones, UA: NEGATIVE
NITRITE UA: POSITIVE
PH UA: 5 (ref 5.0–8.0)
RBC UA: NEGATIVE
SPEC GRAV UA: 1.02 (ref 1.010–1.025)
UROBILINOGEN UA: NEGATIVE U/dL — AB

## 2017-09-22 NOTE — Progress Notes (Signed)
THIS RECORD MAY CONTAIN CONFIDENTIAL INFORMATION THAT SHOULD NOT BE RELEASED WITHOUT REVIEW OF THE SERVICE PROVIDER.  Adolescent Medicine Consultation Follow-Up Visit Amanda Conley  is a 21 y.o. female referred by Almon Hercules, MD here today for follow-up regarding disordered eating, anxiety, malnutrition.    Last seen in [facility] on 08/31/17 for the above.  Plan at last visit included continue remeron daily, continue with treatment team. Plan for [facility] in December.  Pertinent Labs? No Growth Chart Viewed? yes   History was provided by the patient and mother.  Interpreter? no  PCP Confirmed?  yes  [name]?   yes   Chief Complaint  Patient presents with  . Follow-up  . Eating Disorder    HPI:    Going to school at [employer]. No concerns today.  Mom says she was sick this week with stomach problems. Some vomiting and diarrhea. That has stopped. It lasted about 4 days. She was unable to take in much during this time.  Still considering [facility] after her finals are over at the beginning of December.   24 hour recall:  D: pizza with fries  S: boost  B: bagel and cream cheese, tater tots  S: boost  No lunch- she was in class   Still taking OCP. Having period every month. Was having cramping and saw GYN for this problem.   Review of Systems  Constitutional: Negative for malaise/fatigue.  Eyes: Negative for double vision.  Respiratory: Negative for shortness of breath.   Cardiovascular: Negative for chest pain and palpitations.  Gastrointestinal: Positive for diarrhea and vomiting. Negative for abdominal pain, constipation and nausea.  Genitourinary: Negative for dysuria.  Musculoskeletal: Negative for joint pain and myalgias.  Skin: Negative for rash.  Neurological: Negative for dizziness and headaches.  Endo/Heme/Allergies: Does not bruise/bleed easily.  Psychiatric/Behavioral: Negative for depression. The patient is not nervous/anxious.        Patient's last menstrual period was 08/31/2017 (exact date). Allergies  Allergen Reactions  . Lamotrigine Rash   Outpatient Medications Prior to Visit  Medication Sig Dispense Refill  . mirtazapine (REMERON) 15 MG tablet TAKE 1 TABLET(15 MG) BY MOUTH AT BEDTIME 90 tablet 0  . acetaminophen (TYLENOL) 325 MG tablet Take 325-650 mg by mouth 2 (two) times daily as needed for moderate pain or headache. Reported on 05/14/2016    . cholecalciferol 400 units tablet Take 2 tablets (800 Units total) by mouth daily. (Patient not taking: Reported on 06/09/2017) 30 each 0  . famotidine (PEPCID) 20 MG tablet Take 1 tablet (20 mg total) by mouth 2 (two) times daily after a meal. (Patient not taking: Reported on 09/22/2017) 60 tablet 2  . Fluoxetine HCl, PMDD, 10 MG CAPS Take 1 capsule (10 mg total) by mouth daily. (Patient not taking: Reported on 06/09/2017) 30 each 0  . LORazepam (ATIVAN) 0.5 MG tablet Take 0.5 tablets (0.25 mg total) by mouth 3 (three) times daily before meals. (Patient not taking: Reported on 06/09/2017) 30 tablet 0  . Multiple Vitamins-Iron (MULTIVITAMINS WITH IRON) TABS tablet Take 1 tablet by mouth daily. (Patient not taking: Reported on 07/22/2017) 30 tablet 2  . OLANZapine (ZYPREXA) 5 MG tablet Take 1 tablet (5 mg total) by mouth at bedtime. (Patient not taking: Reported on 06/09/2017) 30 tablet 1   No facility-administered medications prior to visit.      Patient Active Problem List   Diagnosis Date Noted  . Adjustment disorder with mixed anxiety and depressed mood 02/19/2016  .  Eating disorder   . Malnutrition compromising bodily function (HCC)   . DRESS syndrome 01/21/2016    The following portions of the patient's history were reviewed and updated as appropriate: allergies, current medications, past family history, past medical history, past social history, past surgical history and problem list.  Physical Exam:  Vitals:   09/22/17 1502 09/22/17 1516  BP: 97/66 109/78   Pulse: 73 (!) 116  Weight: 93 lb 6.4 oz (42.4 kg)   Height: 5' 4.17" (1.63 m)    BP 109/78 (BP Location: Right Arm, Patient Position: Standing, Cuff Size: Normal)   Pulse (!) 116   Ht 5' 4.17" (1.63 m)   Wt 93 lb 6.4 oz (42.4 kg)   LMP 08/31/2017 (Exact Date)   BMI 15.95 kg/m  Body mass index: body mass index is 15.95 kg/m. Blood pressure percentiles are 33 % systolic and 90 % diastolic based on the August 2017 AAP Clinical Practice Guideline. Blood pressure percentile targets: 90: 127/78, 95: 129/81, 95 + 12 mmHg: 141/93.   Physical Exam  Constitutional: She appears well-developed. No distress.  HENT:  Mouth/Throat: Oropharynx is clear and moist.  Neck: No thyromegaly present.  Cardiovascular: Normal rate and regular rhythm.   No murmur heard. Pulmonary/Chest: Breath sounds normal.  Abdominal: Soft. She exhibits no mass. There is no tenderness. There is no guarding.  Musculoskeletal: She exhibits no edema.  Lymphadenopathy:    She has no cervical adenopathy.  Neurological: She is alert.  Skin: Skin is warm. No rash noted.  Psychiatric: She has a normal mood and affect.  Nursing note and vitals reviewed.   Assessment/Plan: 1. Malnutrition compromising bodily function (HCC) Has lost 4 pounds since last visit, likely associated with the vomiting and diarrheal illness she had in the past week. Discussed need to really make up for losses to prevent further illness.   2. Adjustment disorder with mixed anxiety and depressed mood Continues on remeron 15 mg daily. No concerns today.   3. Eating disorder Ultimately needs HLC as she is still relying heavily on liquid nutrition and is still very underweight.   4. Routine screening for STI (sexually transmitted infection) Per protocol.   5. Screening for genitourinary condition Urine + for nitrites. She denies any current sx. Will send for culture. It is likely e. Coli UTI. Will treat once culture returns.  - POCT urinalysis  dipstick - Urine Culture - C. trachomatis/N. gonorrhoeae RNA    Follow-up:  3 weeks   Medical decision-making:  >15 minutes spent face to face with patient with more than 50% of appointment spent discussing diagnosis, management, follow-up, and reviewing of anxiety, depression, disordered eating, urine.

## 2017-09-23 LAB — C. TRACHOMATIS/N. GONORRHOEAE RNA
C. TRACHOMATIS RNA, TMA: NOT DETECTED
N. GONORRHOEAE RNA, TMA: NOT DETECTED

## 2017-09-25 LAB — URINE CULTURE
MICRO NUMBER:: 81227385
SPECIMEN QUALITY: ADEQUATE

## 2017-09-26 ENCOUNTER — Other Ambulatory Visit: Payer: Self-pay | Admitting: Pediatrics

## 2017-09-26 MED ORDER — NITROFURANTOIN MONOHYD MACRO 100 MG PO CAPS: 100.0000 mg | ORAL_CAPSULE | Freq: Two times a day (BID) | ORAL | 0 refills | Status: AC

## 2017-09-28 ENCOUNTER — Encounter: Payer: Federal, State, Local not specified - PPO | Attending: Obstetrics and Gynecology | Admitting: *Deleted

## 2017-09-28 DIAGNOSIS — E46 Unspecified protein-calorie malnutrition: Secondary | ICD-10-CM

## 2017-09-28 DIAGNOSIS — Z713 Dietary counseling and surveillance: Secondary | ICD-10-CM | POA: Diagnosis not present

## 2017-09-28 DIAGNOSIS — F509 Eating disorder, unspecified: Secondary | ICD-10-CM

## 2017-09-28 DIAGNOSIS — R636 Underweight: Secondary | ICD-10-CM | POA: Diagnosis not present

## 2017-09-28 NOTE — Progress Notes (Signed)
appt start time 1600 appt end time 1630  Assessment: Amanda Conley is here for follow up nutrition counseling pertaining to disordered eating Very anxious.  Even more stressed Did not check into any therapy groups Seeing therapist every 2 weeks Doesn't want to change her medication Sometimes not hungry  Sleeping well Poor energy No dizziness No headaches No more nausea Still cold No stomachaches Normal BM  Hasn't talked to parents about [name].  still wants to go after the semseter Eating is "ok".  Having snacks and 2-3 meals   Growth Metrics: Ideal BMI for age: 70.5  BMI today:  16         % Ideal today: 73  % Previous growth data: largely unavailable.  3 data plots since age 21, nothing prior to age 21              weight/age  <5th; height/age at 25th%; goal is for BMI at 25% = 19.4 Goal rate of weight gain: 0.5-1.0 lb/week    Eating is going better.  2 meals and some snacks.   Safe foods: chicken tenders, pizza, fries and tots, cheeseburgers, pasta Fear foods: pickles  24 hour recall B: bagel with cream cheese, eggs, coffee, water, cereal S: crackers with cheese Boost L: grilled cheese, tater tots, sprite Cheese popcorn Boost D: pancakes, sausage with water S: pretzles with water  Normal No breakfast Everything else   Exercise: walking around campus    Nutrition Diagnosis: NB-1.5 Disordered eating pattern As related to history of inadequate intake.  As evidenced by BMI/age <5th%.  Intervention: needs HLC.  Challenged ED.   3 meals and 3 snacks daily.  Set schedule on her phone   Monitoring Follow up in 2 weeks

## 2017-10-06 DIAGNOSIS — F5001 Anorexia nervosa, restricting type: Secondary | ICD-10-CM | POA: Diagnosis not present

## 2017-10-19 ENCOUNTER — Ambulatory Visit: Payer: Federal, State, Local not specified - PPO | Admitting: Family

## 2017-10-20 ENCOUNTER — Ambulatory Visit: Payer: Self-pay | Admitting: *Deleted

## 2017-10-25 DIAGNOSIS — F5001 Anorexia nervosa, restricting type: Secondary | ICD-10-CM | POA: Diagnosis not present

## 2017-11-02 ENCOUNTER — Ambulatory Visit: Payer: Federal, State, Local not specified - PPO | Admitting: Family

## 2017-11-10 ENCOUNTER — Encounter: Payer: Self-pay | Admitting: *Deleted

## 2017-11-10 ENCOUNTER — Encounter: Payer: Federal, State, Local not specified - PPO | Attending: Obstetrics and Gynecology | Admitting: *Deleted

## 2017-11-10 DIAGNOSIS — R636 Underweight: Secondary | ICD-10-CM | POA: Insufficient documentation

## 2017-11-10 DIAGNOSIS — Z713 Dietary counseling and surveillance: Secondary | ICD-10-CM | POA: Diagnosis not present

## 2017-11-10 NOTE — Progress Notes (Signed)
appt start time 1500 appt end time 1600  Assessment: Amanda Conley is here for follow up nutrition counseling pertaining to disordered eating [facility]  She does not know what the plan is Sleeping a lot. Poor energy level No dizziness.   Headaches daily.  Takes asprin and that helps No GI distress.  Normal BM Positive for cold intolerance  Mood is labile.  More happy than other emotions.  Anxiety not as bad now that she isn't in school Eating varies.  Some days eats enough and some days doesn't  Typically 2 meals .  Sometimes eats none Typically 2-3 snacks.  When she does eat , it hurts sometimes.   Fatigue keeps her from eating some Sometimes is more anxious Some no appetite  LMP 11/28.  Regular cycle  Weight loss  Growth Metrics: Ideal BMI for age: 34.5  BMI today:  15.6         % Ideal today: 72.5  % Previous growth data: largely unavailable.  3 data plots since age 21, nothing prior to age 21              weight/age  <5th; height/age at 25th%; goal is for BMI at 25% = 19.4 Goal rate of weight gain: 0.5-1.0 lb/week    Eating is going better.  2 meals and some snacks.   Safe foods: chicken tenders, pizza, fries and tots, cheeseburgers, pasta Fear foods: pickles  24 hour recall B: 2 scrambled eggs in butter.  With 2 toast plain.  Water S: hot chocolate L: bologna sandwich, chips. gingerale   Exercise: none   Nutrition Diagnosis: NB-1.5 Disordered eating pattern As related to history of inadequate intake.  As evidenced by BMI/age <5th%.  Intervention: needs HLC.  Challenged ED.   3 meals and 3 snacks daily. Gave recommendations for support.  discussed with mom need for HLC.  Mom reports finances do not permit residential treatment at this time.  I suggested [facility] grant    Monitoring Follow up prn as this provider will be [place] of the country

## 2017-11-10 NOTE — Patient Instructions (Signed)
It's gonna be ok Nothing bad is going to happen Is isn't going to last too long  [facility] Also check out free support group online [email or web address] Please read 8 Steps to Revovery

## 2017-11-30 ENCOUNTER — Encounter: Payer: Self-pay | Admitting: Family

## 2017-11-30 ENCOUNTER — Ambulatory Visit (INDEPENDENT_AMBULATORY_CARE_PROVIDER_SITE_OTHER): Payer: Federal, State, Local not specified - PPO | Admitting: Family

## 2017-11-30 VITALS — BP 105/76 | HR 106 | Ht 64.57 in | Wt 91.4 lb

## 2017-11-30 DIAGNOSIS — Z1389 Encounter for screening for other disorder: Secondary | ICD-10-CM

## 2017-11-30 DIAGNOSIS — F509 Eating disorder, unspecified: Secondary | ICD-10-CM | POA: Diagnosis not present

## 2017-11-30 DIAGNOSIS — E46 Unspecified protein-calorie malnutrition: Secondary | ICD-10-CM | POA: Diagnosis not present

## 2017-11-30 DIAGNOSIS — F4323 Adjustment disorder with mixed anxiety and depressed mood: Secondary | ICD-10-CM | POA: Diagnosis not present

## 2017-11-30 LAB — POCT URINALYSIS DIPSTICK
APPEARANCE: NORMAL
Bilirubin, UA: NEGATIVE
Blood, UA: NEGATIVE
GLUCOSE UA: NEGATIVE
Ketones, UA: NEGATIVE
Nitrite, UA: NEGATIVE
Odor: NORMAL
Protein, UA: NEGATIVE
Spec Grav, UA: 1.025 (ref 1.010–1.025)
Urobilinogen, UA: NEGATIVE E.U./dL — AB
pH, UA: 5 (ref 5.0–8.0)

## 2017-11-30 MED ORDER — MIRTAZAPINE 15 MG PO TABS: ORAL_TABLET | ORAL | 0 refills | Status: DC

## 2017-11-30 NOTE — Progress Notes (Signed)
THIS RECORD MAY CONTAIN CONFIDENTIAL INFORMATION THAT SHOULD NOT BE RELEASED WITHOUT REVIEW OF THE SERVICE PROVIDER.  Adolescent Medicine Consultation Follow-Up Visit Amanda Conley  is a 22 y.o. female referred by Amanda HerculesGonfa, [name], MD here today for follow-up regarding eating disorder, malnutrition.   Last seen in [facility]olescent Medicine Clinic on 09/22/17 for same.  Plan at last visit included Remeron 15 mg nightly; she had 4 lb loss at that visit s/p illness.   Pertinent Labs? No Growth Chart Viewed? no   History was provided by the patient.  Interpreter? no  PCP Confirmed?  no  [name]?   no    Chief Complaint  Patient presents with  . Follow-up  . Eating Disorder    HPI:   -no changes since last visit.  -taking remeron 15 mg -classes are going ok since winter break ended -seeing therapy/RD -denies SI/HI  Review of Systems  Constitutional: Negative for malaise/fatigue.  Eyes: Negative for double vision.  Respiratory: Negative for shortness of breath.   Cardiovascular: Negative for chest pain and palpitations.  Gastrointestinal: Negative for abdominal pain, constipation, diarrhea, nausea and vomiting.  Genitourinary: Negative for dysuria.  Musculoskeletal: Negative for joint pain and myalgias.  Skin: Negative for rash.  Neurological: Negative for dizziness and headaches.  Endo/Heme/Allergies: Does not bruise/bleed easily.     No LMP recorded. Allergies  Allergen Reactions  . Lamotrigine Rash   Outpatient Medications Prior to Visit  Medication Sig Dispense Refill  . acetaminophen (TYLENOL) 325 MG tablet Take 325-650 mg by mouth 2 (two) times daily as needed for moderate pain or headache. Reported on 05/14/2016    . JUNEL FE 1/20 1-20 MG-MCG tablet TK 1 Conley PO QD UTD  1  . mirtazapine (REMERON) 15 MG tablet TAKE 1 TABLET(15 MG) BY MOUTH AT BEDTIME 90 tablet 0   No facility-administered medications prior to visit.      Patient Active Problem List   Diagnosis Date Noted  . Adjustment disorder with mixed anxiety and depressed mood 02/19/2016  . Eating disorder   . Malnutrition compromising bodily function (HCC)   . DRESS syndrome 01/21/2016   Growth Metrics: Ideal BMI for age:84.5 BMI today: 15.6% Ideal today:72.5% Previous growth data: largely unavailable. 3 data plots since age 22, nothing prior to age 22 weight/age <5th; height/age at 25th%; goal is for BMI at 25% = 19.4 Goal rate of weight gain:0.5-1.0 lb/week Physical Exam:  Vitals:   11/30/17 1538  Weight: 91 lb 6.4 oz (41.5 kg)  Height: 5' 4.57" (1.64 m)   Ht 5' 4.57" (1.64 m)   Wt 91 lb 6.4 oz (41.5 kg)   BMI 15.41 kg/m  Body mass index: body mass index is 15.41 kg/m. No blood pressure reading on file for this encounter.  Wt Readings from Last 3 Encounters:  11/30/17 91 lb 6.4 oz (41.5 kg) (<1 %, Z= -2.67)*  11/10/17 90 lb 3.2 oz (40.9 kg) (<1 %, Z= -2.80)*  09/22/17 93 lb 6.4 oz (42.4 kg) (<1 %, Z= -2.44)*   * Growth percentiles are based on CDC (Girls, 2-20 Years) data.    Physical Exam  Constitutional: No distress.  Thin habitus  HENT:  Mouth/Throat: Oropharynx is clear and moist.  Eyes: No scleral icterus.  Neck: No thyromegaly present.  Cardiovascular: Normal heart sounds.  No murmur heard. Pulmonary/Chest: Effort normal.  Musculoskeletal: She exhibits no edema.  Lymphadenopathy:    She has no cervical adenopathy.  Neurological: She is alert.  Skin: Skin is  dry.  Cool extremities   Psychiatric: Her mood appears anxious. She is withdrawn.    Assessment/Plan: 1. Malnutrition compromising bodily function (HCC) -1 lb improvement from last OV's loss -continue remeron  -return precautions  -she is stable but not restoring; continue tx team approach   2. Eating disorder -as above - mirtazapine (REMERON) 15 MG tablet; TAKE 1 TABLET(15 MG) BY MOUTH AT BEDTIME  Dispense: 90 tablet; Refill: 0  3. Adjustment  disorder with mixed anxiety and depressed mood -as above -she is safe on medication dose - mirtazapine (REMERON) 15 MG tablet; TAKE 1 TABLET(15 MG) BY MOUTH AT BEDTIME  Dispense: 90 tablet; Refill: 0  4. Screening for genitourinary condition -reviewed  - POCT urinalysis dipstick  Follow-up:  One month    Medical decision-making:  >15 minutes spent face to face with patient with more than 50% of appointment spent discussing diagnosis, management, follow-up, and reviewing plan of care, including treatment team approach, medication management, and return precautions.

## 2017-12-02 ENCOUNTER — Encounter: Payer: Self-pay | Admitting: Family

## 2017-12-28 ENCOUNTER — Encounter: Payer: Federal, State, Local not specified - PPO | Attending: Obstetrics and Gynecology | Admitting: *Deleted

## 2017-12-28 DIAGNOSIS — F509 Eating disorder, unspecified: Secondary | ICD-10-CM

## 2017-12-28 DIAGNOSIS — Z713 Dietary counseling and surveillance: Secondary | ICD-10-CM | POA: Diagnosis not present

## 2017-12-28 DIAGNOSIS — R636 Underweight: Secondary | ICD-10-CM | POA: Insufficient documentation

## 2017-12-28 NOTE — Progress Notes (Signed)
appt start time 1500 appt end time 1530  Assessment: Amanda Conley is here for follow up nutrition counseling pertaining to disordered eating Enjoying her classes.  Sleeping decently.  Good energy level No dizziness.  No headaches Negative for cold intolerance No GI distress. Normal BM Hasn't seen therapist since December.  Mood is good.  Anxiety is low.  [facility] LMP 12/13/17  Denies any concerns.   Eats 3 meals and snacks.  Eats in the cafeteria. Has a meal plan Thinks she is eating too little.  Is still hungry at the end of the day.  Fruits and vegetables are low because she doesn't like the options at the cafeteria  Can focus better and that helps school performance. Eating around other people is hard.  Feels a little judged by others.   Weight appears to be increasing on my scale   Growth Metrics: Ideal BMI for age: 32.5  BMI today:  16         % Ideal today: 73  % Previous growth data: largely unavailable.  3 data plots since age 22, nothing prior to age [age]              weight/age  <5th; height/age at 25th%; goal is for BMI at 25% = 19.4 Goal rate of weight gain: 0.5-1.0 lb/week   Eating is going better.  2 meals and some snacks.   Safe foods: chicken tenders, pizza, fries and tots, cheeseburgers, pasta Fear foods: pickles  24 hour recall B: 2 mini waffles, biscuit with gravy.  Ham, water S: yogurt with chocolate and almonds.  Water L: sub with fries, water S: popcorn, water D: pasta, 2 slices pizza.  Water S: popcorn   Exercise: none   Nutrition Diagnosis: NB-1.5 Disordered eating pattern As related to history of inadequate intake.  As evidenced by BMI/age <5th%.  Intervention:  Try to have V8 juice in your dorm room.  Try for larger lunch and dinner Great job!!!! Keep it up!!! Talk back to Ed voice   Monitoring Follow up in 2 weeks

## 2018-01-02 ENCOUNTER — Ambulatory Visit: Payer: Federal, State, Local not specified - PPO | Admitting: Pediatrics

## 2018-01-02 ENCOUNTER — Encounter: Payer: Self-pay | Admitting: Pediatrics

## 2018-01-02 VITALS — BP 111/75 | HR 104 | Ht 63.78 in | Wt 93.4 lb

## 2018-01-02 DIAGNOSIS — Z3202 Encounter for pregnancy test, result negative: Secondary | ICD-10-CM | POA: Diagnosis not present

## 2018-01-02 DIAGNOSIS — F4323 Adjustment disorder with mixed anxiety and depressed mood: Secondary | ICD-10-CM

## 2018-01-02 DIAGNOSIS — E46 Unspecified protein-calorie malnutrition: Secondary | ICD-10-CM | POA: Diagnosis not present

## 2018-01-02 DIAGNOSIS — F509 Eating disorder, unspecified: Secondary | ICD-10-CM

## 2018-01-02 DIAGNOSIS — Z1389 Encounter for screening for other disorder: Secondary | ICD-10-CM

## 2018-01-02 LAB — POCT URINALYSIS DIPSTICK
BILIRUBIN UA: NEGATIVE
Blood, UA: NEGATIVE
Glucose, UA: NEGATIVE
Ketones, UA: NEGATIVE
Nitrite, UA: NEGATIVE
PROTEIN UA: NEGATIVE
Spec Grav, UA: 1.02 (ref 1.010–1.025)
Urobilinogen, UA: 0.2 E.U./dL
pH, UA: 5 (ref 5.0–8.0)

## 2018-01-02 LAB — POCT URINE PREGNANCY: Preg Test, Ur: NEGATIVE

## 2018-01-02 MED ORDER — NORETHIN ACE-ETH ESTRAD-FE 1-20 MG-MCG PO TABS: ORAL_TABLET | ORAL | 4 refills | Status: DC

## 2018-01-02 NOTE — Progress Notes (Signed)
THIS RECORD MAY CONTAIN CONFIDENTIAL INFORMATION THAT SHOULD NOT BE RELEASED WITHOUT REVIEW OF THE SERVICE PROVIDER.  Adolescent Medicine Consultation Follow-Up Visit Amanda Conley  is a 22 y.o. female referred by [name], [name], MD here today for follow-up regarding disordered eating, malnutrition, anxiety, depression.    Last seen in [facility]olescent Medicine Clinic on 11/30/17 for the above.  Plan at last visit included conitnue remeron and tx team.  Pertinent Labs? No Growth Chart Viewed? yes   History was provided by the patient.  Interpreter? no  PCP Confirmed?  yes  [name]?   yes   Chief Complaint  Patient presents with  . Follow-up    HPI:    Going to [facility] at [employer] and studying sociology.  Last took OCP last month. Had a period since then. It was normal. Sexually active but using condoms. She would like to restart birth control pill.  Mood has been fine. Stopped taking remeron. Does not feel she needs any medications at this time.   24 hour recall:  B: Toast, sausage, waffles S: brownie bits  L: 10 chicken nuggets with fries  Water to drink   Eating in the dining hall.    Review of Systems  Constitutional: Negative for malaise/fatigue.  Eyes: Negative for double vision.  Respiratory: Negative for shortness of breath.   Cardiovascular: Negative for chest pain and palpitations.  Gastrointestinal: Negative for abdominal pain, constipation, diarrhea, nausea and vomiting.  Genitourinary: Negative for dysuria.  Musculoskeletal: Negative for joint pain and myalgias.  Skin: Negative for rash.  Neurological: Negative for dizziness and headaches.  Endo/Heme/Allergies: Does not bruise/bleed easily.     Patient's last menstrual period was 12/13/2017 (exact date). Allergies  Allergen Reactions  . Lamotrigine Rash   Outpatient Medications Prior to Visit  Medication Sig Dispense Refill  . JUNEL FE 1/20 1-20 MG-MCG tablet TK 1 T PO QD UTD  1  . acetaminophen  (TYLENOL) 325 MG tablet Take 325-650 mg by mouth 2 (two) times daily as needed for moderate pain or headache. Reported on 05/14/2016    . mirtazapine (REMERON) 15 MG tablet TAKE 1 TABLET(15 MG) BY MOUTH AT BEDTIME (Patient not taking: Reported on 01/02/2018) 90 tablet 0   No facility-administered medications prior to visit.      Patient Active Problem List   Diagnosis Date Noted  . Adjustment disorder with mixed anxiety and depressed mood 02/19/2016  . Eating disorder   . Malnutrition compromising bodily function (HCC)   . DRESS syndrome 01/21/2016    The following portions of the patient's history were reviewed and updated as appropriate: allergies, current medications, past family history, past medical history, past social history, past surgical history and problem list.  Physical Exam:  Vitals:   01/02/18 1453  BP: 111/75  Pulse: (!) 104  Weight: 93 lb 6.4 oz (42.4 kg)  Height: 5' 3.78" (1.62 m)   BP 111/75   Pulse (!) 104   Ht 5' 3.78" (1.62 m)   Wt 93 lb 6.4 oz (42.4 kg)   LMP 12/13/2017 (Exact Date)   BMI 16.14 kg/m  Body mass index: body mass index is 16.14 kg/m. Blood pressure percentiles are 42 % systolic and 85 % diastolic based on the August 2017 AAP Clinical Practice Guideline. Blood pressure percentile targets: 90: 127/78, 95: 129/81, 95 + 12 mmHg: 141/93.   Physical Exam  Constitutional: She appears well-developed. No distress.  HENT:  Mouth/Throat: Oropharynx is clear and moist.  Neck: No thyromegaly present.  Cardiovascular: Normal rate and regular rhythm.  No murmur heard. Pulmonary/Chest: Breath sounds normal.  Abdominal: Soft. She exhibits no mass. There is no tenderness. There is no guarding.  Musculoskeletal: She exhibits no edema.  Lymphadenopathy:    She has no cervical adenopathy.  Neurological: She is alert.  Skin: Skin is warm. No rash noted.  Psychiatric: She has a normal mood and affect.  Nursing note and vitals  reviewed.   Assessment/Plan: 1. Malnutrition compromising bodily function (HCC) Weight is up from our last office visit but down from dietitian's office although different scale and in gown here. Will continue to monitor. Says she had a regular period off OCPs.   2. Eating disorder Eating better and denies any challenge foods today. Eating in the dining hall.   3. Adjustment disorder with mixed anxiety and depressed mood Stopped remeron herself. Hasn't been to therapist recently but plans to go back soon and make another appt.   4. Screening for genitourinary condition Some leuks but no sx.  - POCT urinalysis dipstick  5. Pregnancy examination or test, negative result Wants to restart OCP. Neg urine preg today and always using condoms.  - POCT urine pregnancy   BH screenings: PHQSADs reviewed and indicated no anxiety or depressive sx. Screens discussed with patient and parent and adjustments to plan made accordingly.   Follow-up:  1 month   Medical decision-making:  >15 minutes spent face to face with patient with more than 50% of appointment spent discussing diagnosis, management, follow-up, and reviewing of anxiety, depression, eating disorder, malnutrition, OCP.

## 2018-01-12 ENCOUNTER — Encounter: Payer: Federal, State, Local not specified - PPO | Admitting: *Deleted

## 2018-01-12 DIAGNOSIS — E46 Unspecified protein-calorie malnutrition: Secondary | ICD-10-CM

## 2018-01-12 DIAGNOSIS — F509 Eating disorder, unspecified: Secondary | ICD-10-CM

## 2018-01-12 DIAGNOSIS — Z713 Dietary counseling and surveillance: Secondary | ICD-10-CM | POA: Diagnosis not present

## 2018-01-12 DIAGNOSIS — R636 Underweight: Secondary | ICD-10-CM | POA: Diagnosis not present

## 2018-01-12 NOTE — Progress Notes (Signed)
appt start time 1500 appt end time 1530  Assessment: Amanda Conley is here for follow up nutrition counseling pertaining to disordered eating.  Weight appears to be down somewhat, but it's different scale.  Will use adolescent medicine's scale as reference  Enjoying her classes.  Trying to do more things socially Sleeping well.   Good energy level No dizziness.  No headaches Negative for cold intolerance No GI distress. Normal BM  Hasn't seen therapist since December.  Anxiety is low.   LMP yesterday  Eating is really good. Eats whenever she is hungry, but realizes that there foods that she is still avoiding.  Like soda, greasy meat, too many things at once.     Growth Metrics: Ideal BMI for age: 60.5  BMI today:  16.4         % Ideal today: 73  % Previous growth data: largely unavailable.  3 data plots since age 22, nothing prior to age 22              weight/age  <5th; height/age at 25th%; goal is for BMI at 25% = 19.4 Goal rate of weight gain: 0.5-1.0 lb/week   Eating is going better.  2 meals and some snacks.   Safe foods: chicken tenders, pizza, fries and tots, cheeseburgers, pasta Fear foods: pickles  24 hour recall B: blueberry bagel with cream cheese, 2 min waffles, water tater tots S: apple and peanut butter L: 2 mini sliders, veggie chips, 2 oreos, water. Apple S: veggie chips, water D: pb and J, veggie chips, apple.  Water S: caramel popcorn, water, cookies   Exercise: none   Nutrition Diagnosis: NB-1.5 Disordered eating pattern As related to history of inadequate intake.  As evidenced by BMI/age <5th%.  Intervention: challenged ED voice about specific foods.  Please reschedule with therapist   Monitoring Follow up in 2 weeks

## 2018-01-30 ENCOUNTER — Other Ambulatory Visit: Payer: Self-pay | Admitting: Family

## 2018-01-30 DIAGNOSIS — F4323 Adjustment disorder with mixed anxiety and depressed mood: Secondary | ICD-10-CM

## 2018-01-30 DIAGNOSIS — F509 Eating disorder, unspecified: Secondary | ICD-10-CM

## 2018-01-31 ENCOUNTER — Encounter: Payer: Federal, State, Local not specified - PPO | Attending: Obstetrics and Gynecology | Admitting: *Deleted

## 2018-01-31 ENCOUNTER — Ambulatory Visit: Payer: Federal, State, Local not specified - PPO | Admitting: *Deleted

## 2018-01-31 DIAGNOSIS — R636 Underweight: Secondary | ICD-10-CM | POA: Diagnosis not present

## 2018-01-31 DIAGNOSIS — Z713 Dietary counseling and surveillance: Secondary | ICD-10-CM | POA: Insufficient documentation

## 2018-01-31 NOTE — Progress Notes (Signed)
  appt start time 1430 appt end time 1515  Assessment: Amanda Conley is here for follow up nutrition counseling pertaining to disordered eating.  Didn't check weight as she will be seen by adolescent medicine next week  "Sleeping ok."  Further exploration reveals she Wakes up. 1-2 times.  Trouble falling asleep.  Says it's stress; Thinks everyone else is stressed too. Hasn't been to therapy consistently.  Stopped remeron:  Doesn't want to get dependent on thinks she should deal with it herself.  acknowledged that anxiety is higher than last time and that when she is anxious her health declines, eating declines, and she becomes basically bedridden.  acknowledges that medication could help  "it's hard" to drink anything else besides water; Realizes it's restriction  No headaches No dizziness No difficulty focusing No N/V.  No Gi distress.  normal BM    24 hour recall B: french toast waffle.  Bagel with cream cheese.  Water.  Potatoes with ketchup S: coffee with chips L: burger with fries.  Cake.  Water S: chips, gummy candy.  Water D: PB and J, chips.  Water S: gummy candy   Exercise: none   Nutrition Diagnosis: NB-1.5 Disordered eating pattern As related to history of inadequate intake.  As evidenced by BMI/age <5th%.  Intervention: challenged ED voice about caloric beverages.  Please reschedule with therapist.  Please talk to christy next week about meds   Monitoring Follow up in 2 weeks

## 2018-02-08 ENCOUNTER — Other Ambulatory Visit: Payer: Self-pay | Admitting: Family

## 2018-02-08 ENCOUNTER — Encounter: Payer: Self-pay | Admitting: Family

## 2018-02-08 ENCOUNTER — Ambulatory Visit (INDEPENDENT_AMBULATORY_CARE_PROVIDER_SITE_OTHER): Payer: Federal, State, Local not specified - PPO | Admitting: Family

## 2018-02-08 VITALS — BP 109/69 | HR 63 | Ht 64.17 in | Wt 96.2 lb

## 2018-02-08 DIAGNOSIS — F4323 Adjustment disorder with mixed anxiety and depressed mood: Secondary | ICD-10-CM | POA: Diagnosis not present

## 2018-02-08 DIAGNOSIS — Z3009 Encounter for other general counseling and advice on contraception: Secondary | ICD-10-CM | POA: Diagnosis not present

## 2018-02-08 DIAGNOSIS — Z1389 Encounter for screening for other disorder: Secondary | ICD-10-CM

## 2018-02-08 DIAGNOSIS — F509 Eating disorder, unspecified: Secondary | ICD-10-CM | POA: Diagnosis not present

## 2018-02-08 DIAGNOSIS — Z3202 Encounter for pregnancy test, result negative: Secondary | ICD-10-CM | POA: Diagnosis not present

## 2018-02-08 LAB — POCT URINALYSIS DIPSTICK
Bilirubin, UA: NEGATIVE
GLUCOSE UA: NEGATIVE
Ketones, UA: NEGATIVE
LEUKOCYTES UA: NEGATIVE
Nitrite, UA: NEGATIVE
PH UA: 6.5 (ref 5.0–8.0)
Protein, UA: NEGATIVE
RBC UA: NEGATIVE
Spec Grav, UA: 1.02 (ref 1.010–1.025)
UROBILINOGEN UA: NEGATIVE U/dL — AB

## 2018-02-08 LAB — POCT URINE PREGNANCY: Preg Test, Ur: NEGATIVE

## 2018-02-08 MED ORDER — MIRTAZAPINE 15 MG PO TABS: 15.0000 mg | ORAL_TABLET | Freq: Every day | ORAL | 0 refills | Status: DC

## 2018-02-08 MED ORDER — CYCLOBENZAPRINE HCL 10 MG PO TABS: 10.0000 mg | ORAL_TABLET | Freq: Every day | ORAL | 0 refills | Status: DC

## 2018-02-08 NOTE — Progress Notes (Signed)
THIS RECORD MAY CONTAIN CONFIDENTIAL INFORMATION THAT SHOULD NOT BE RELEASED WITHOUT REVIEW OF THE SERVICE PROVIDER.  Adolescent Medicine Consultation Follow-Up Visit Amanda Conley  is a 22 y.o. female referred by Amanda Hercules, MD here today for follow-up regarding anxiety, disordered eating, and contraception .    Last seen in [facility] Clinic on 01/02/18 for Disordered eating.  Plan at last visit included continue with healthy eating choices.  Restarted on OCPs.Amanda Conley Chart Viewed? yes   History was provided by the patient.  Interpreter? no   Chief Complaint  Patient presents with  . Follow-up  . Eating Disorder    W/O EVS    HPI:    Going to school at [employer] and studying sociology.  Sexually active but using condoms.Would like to change birth control method.  Currently having her period.  Started four days ago.   Had a panic attack around a week ago.  Reports some stress around school and mid-terms.  Requesting to restart remeron.    24 hour recall:  B: Potatoes, toast, muffin, apple, french toast L: Apple, taco, Amanda Conley D: Burrito, chips, apple B: Potatoes, [place] toast, Toast w/ cream cheese. Snacks: Chips here and there.  Water to drink     No LMP recorded. Allergies  Allergen Reactions  . Lamotrigine Rash   Outpatient Medications Prior to Visit  Medication Sig Dispense Refill  . acetaminophen (TYLENOL) 325 MG tablet Take 325-650 mg by mouth 2 (two) times daily as needed for moderate pain or headache. Reported on 05/14/2016    . norethindrone-ethinyl estradiol (JUNEL FE 1/20) 1-20 MG-MCG tablet TK 1 T PO QD UTD 3 Package 4   No facility-administered medications prior to visit.      Patient Active Problem List   Diagnosis Date Noted  . Adjustment disorder with mixed anxiety and depressed mood 02/19/2016  . Eating disorder   . Malnutrition compromising bodily function (HCC)   . DRESS syndrome 01/21/2016    Social History: Changes with  school since last visit?  no  Activities:  Special interests/hobbies/sports: Likes volunteering (feeding the homeless)     Confidentiality was discussed with the patient and if applicable, with caregiver as well.  Changes at home or school since last visit:  no  Gender identity: F Sex assigned at birth: F Pronouns: she Tobacco?  no, Does vape, but w/o nicotine Drugs/ETOH?  no Partner preference?  female  Sexually Active?  yes  Pregnancy Prevention:  condoms and birth control pills Reviewed condoms:  yes    Suicidal or homicidal thoughts?   no Self injurious behaviors?  no     The following portions of the patient's history were reviewed and updated as appropriate: allergies, current medications, past family history, past medical history, past social history, past surgical history and problem list.  Physical Exam:  Vitals:   02/08/18 1613  BP: 109/69  Pulse: 63  Weight: 96 lb 3.2 oz (43.6 kg)  Height: 5' 4.17" (1.63 m)   BP 109/69   Pulse 63   Ht 5' 4.17" (1.63 m)   Wt 96 lb 3.2 oz (43.6 kg)   BMI 16.42 kg/m  Body mass index: body mass index is 16.42 kg/m. Blood pressure percentiles are 31 % systolic and 63 % diastolic based on the August 2017 AAP Clinical Practice Guideline. Blood pressure percentile targets: 90: 127/78, 95: 129/81, 95 + 12 mmHg: 141/93.  Review of Systems  Constitutional: Negative for chills, diaphoresis, fever and weight loss.  HENT: Negative for congestion, ear discharge, ear pain, hearing loss, nosebleeds, sinus pain, sore throat and tinnitus.   Eyes: Negative for blurred vision, double vision, photophobia, pain, discharge and redness.  Respiratory: Negative for cough, hemoptysis, sputum production, shortness of breath and wheezing.   Cardiovascular: Negative for chest pain, palpitations and orthopnea.  Gastrointestinal: Negative for abdominal pain, blood in stool, constipation, diarrhea, heartburn, melena, nausea and vomiting.  Genitourinary:  Negative for dysuria, frequency, hematuria and urgency.  Musculoskeletal: Negative for back pain, falls, joint pain, myalgias and neck pain.  Skin: Negative for itching and rash.  Neurological: Negative for dizziness, tingling, tremors, weakness and headaches.  Psychiatric/Behavioral: Negative for depression and suicidal ideas. The patient is nervous/anxious.     Physical Exam  Constitutional: She is oriented to person, place, and time. She appears well-developed and well-nourished. No distress.  HENT:  Head: Normocephalic and atraumatic.  Right Ear: External ear normal.  Left Ear: External ear normal.  Nose: Nose normal.  Mouth/Throat: Oropharynx is clear and moist. No oropharyngeal exudate.  Eyes: Pupils are equal, round, and reactive to light. Conjunctivae and EOM are normal. Right eye exhibits no discharge. Left eye exhibits no discharge. No scleral icterus.  Neck: Normal range of motion. Neck supple. No tracheal deviation present. No thyromegaly present.  Cardiovascular: Normal rate, regular rhythm, normal heart sounds and intact distal pulses. Exam reveals no gallop and no friction rub.  No murmur heard. Pulmonary/Chest: Effort normal and breath sounds normal. No stridor. No respiratory distress. She has no wheezes. She has no rales. She exhibits no tenderness.  Abdominal: Soft. Bowel sounds are normal. She exhibits no distension and no mass. There is no tenderness. There is no rebound and no guarding.  Musculoskeletal: Normal range of motion. She exhibits no edema, tenderness or deformity.  Lymphadenopathy:    She has no cervical adenopathy.  Neurological: She is alert and oriented to person, place, and time. She has normal reflexes. Coordination normal.  Skin: Skin is warm and dry. No rash noted. She is not diaphoretic. No erythema. No pallor.  Psychiatric: She has a normal mood and affect. Her behavior is normal. Judgment and thought content normal.    Assessment/Plan:  1.  Adjustment disorder with mixed anxiety and depressed mood Had one panic attack.  Requested to restart Remeron.  Dose started 15mg .    2. Eating disorder Continue with healthy eating habits and eating at school cafetria.  3. Contraceptive education Reviewed contraceptive options.  Narrowed down to Nexplanon or IUD.  After further discussion she requested to have an IUD placed.   Scheduled appt for tomorrow to have IUD placed in clinic.  Prescription given for flexeril to take 4 hours before IUD placement.  Instructed not to drive to appt.  She reports that she takes Benedetto GoadUber to clinic so that will not be a problem.    4. Screening for genitourinary condition WNL.  - POCT urinalysis dipstick  5. Pregnancy examination or test, negative result POCT urine negative. - POCT urine pregnancy  Follow-up:  Tomorrow for IUD insertion 2:30 pm with Christianne Dolinhristy Millican, FNP-C   Medical decision-making:  >25 minutes spent face to face with patient with more than 50% of appointment spent discussing diagnosis, management, follow-up, and reviewing medication management, Tier 1 and Tier 2 contraception options, side effects and adverse effects.

## 2018-02-09 ENCOUNTER — Ambulatory Visit (INDEPENDENT_AMBULATORY_CARE_PROVIDER_SITE_OTHER): Payer: Federal, State, Local not specified - PPO | Admitting: Family

## 2018-02-09 ENCOUNTER — Encounter: Payer: Self-pay | Admitting: Family

## 2018-02-09 VITALS — BP 110/70 | HR 69 | Ht 64.0 in | Wt 96.2 lb

## 2018-02-09 DIAGNOSIS — Z30430 Encounter for insertion of intrauterine contraceptive device: Secondary | ICD-10-CM | POA: Diagnosis not present

## 2018-02-09 DIAGNOSIS — Z113 Encounter for screening for infections with a predominantly sexual mode of transmission: Secondary | ICD-10-CM | POA: Diagnosis not present

## 2018-02-09 MED ORDER — IBUPROFEN 200 MG PO TABS
800.0000 mg | ORAL_TABLET | Freq: Every day | ORAL | Status: DC
  Administered 2018-02-09: 800 mg via ORAL

## 2018-02-09 MED ORDER — LEVONORGESTREL 20 MCG/24HR IU IUD
INTRAUTERINE_SYSTEM | Freq: Once | INTRAUTERINE | Status: AC
  Administered 2018-02-09: 15:00:00 via INTRAUTERINE

## 2018-02-09 NOTE — Patient Instructions (Signed)

## 2018-02-09 NOTE — Progress Notes (Signed)
THIS RECORD MAY CONTAIN CONFIDENTIAL INFORMATION THAT SHOULD NOT BE RELEASED WITHOUT REVIEW OF THE SERVICE PROVIDER.  Adolescent Medicine Consultation Follow-Up Visit Amanda Conley  is a 22 y.o. female referred by [name], [name], MD here today for follow-up regarding IUD placement.    Last seen in Adolescent Medicine Clinic on 02/08/18 for adjustment disorder with mixed anxiety and depressed mood.  Plan at last visit included Remeron 15 mg.  Pertinent Labs? No Growth Chart Viewed? no   History was provided by the patient.  Interpreter? no  PCP Confirmed?  yes  [name]?   yes     HPI:    -presents for IUD placement.  -see procedure note.   Review of Systems  Constitutional: Negative for malaise/fatigue.  Eyes: Negative for double vision.  Respiratory: Negative for shortness of breath.   Cardiovascular: Negative for chest pain and palpitations.  Gastrointestinal: Negative for abdominal pain, constipation, diarrhea, nausea and vomiting.  Genitourinary: Negative for dysuria.  Musculoskeletal: Negative for joint pain and myalgias.  Skin: Negative for rash.  Neurological: Negative for dizziness and headaches.  Endo/Heme/Allergies: Does not bruise/bleed easily.     No LMP recorded. Allergies  Allergen Reactions  . Lamotrigine Rash   Outpatient Medications Prior to Visit  Medication Sig Dispense Refill  . acetaminophen (TYLENOL) 325 MG tablet Take 325-650 mg by mouth 2 (two) times daily as needed for moderate pain or headache. Reported on 05/14/2016    . cyclobenzaprine (FLEXERIL) 10 MG tablet Take 1 tablet (10 mg total) by mouth daily. Take 3-4 hours before your appointment. 1 tablet 0  . mirtazapine (REMERON) 15 MG tablet TAKE 1 TABLET(15 MG) BY MOUTH AT BEDTIME 90 tablet 0  . norethindrone-ethinyl estradiol (JUNEL FE 1/20) 1-20 MG-MCG tablet TK 1 T PO QD UTD 3 Package 4   No facility-administered medications prior to visit.      Patient Active Problem List    Diagnosis Date Noted  . Adjustment disorder with mixed anxiety and depressed mood 02/19/2016  . Eating disorder   . Malnutrition compromising bodily function (HCC)   . DRESS syndrome 01/21/2016    Physical Exam  Constitutional: She appears well-developed. No distress.  HENT:  Head: Normocephalic and atraumatic.  Neck: Normal range of motion. Neck supple.  Cardiovascular: Normal rate and regular rhythm.  No murmur heard. Pulmonary/Chest: Effort normal and breath sounds normal.  Abdominal: Soft.  Musculoskeletal: She exhibits no edema.  Lymphadenopathy:    She has no cervical adenopathy.  Neurological: She is alert.  Skin: Skin is warm and dry. No rash noted.    Assessment/Plan: 1. Encounter for insertion of mirena IUD Mirena IUD Insertion   The pt presents for Mirena IUD placement.  No contraindications for placement.   The patient took 10 mg Flexeril  prior to appt.   No LMP recorded.  UHCG: negative  Last unprotected sex:  N/A  Risks & benefits of IUD discussed  The IUD was purchased and [facility].  Packaging instructions supplied to patient  Consent form signed.  The patient denies any allergies to anesthetics or antiseptics.   Procedure:  Pt was placed in lithotomy position.  Speculum was inserted.  GC/CT swab was used to collect sample for STI testing.  Tenaculum was used to stabilize the cervix by clasping at 12 o'clock  Betadine was used to clean the cervix and cervical os.  Dilators were used. The uterus was sounded to 7 cm.  Mirena was inserted using manufacturer provided applicator.  Lot # was entered into [facility].   Strings were trimmed to 3 cm external to os.  Tenaculum was removed.  Speculum was removed.   The patient was advised to move slowly from a supine to an upright position   The patient denied any concerns or complaints   The patient was instructed to schedule a follow-up appt in 1 month and to call sooner if any concerns.    The patient acknowledged agreement and understanding of the plan.  - IUD Insertion; Future   Follow-up:  One month IUD string check    Medical decision-making:  >25 minutes spent face to face with patient with more than 50% of appointment spent discussing diagnosis, management, follow-up, and reviewing procedure, return precautions, and side effects.

## 2018-02-10 LAB — C. TRACHOMATIS/N. GONORRHOEAE RNA
C. trachomatis RNA, TMA: NOT DETECTED
N. gonorrhoeae RNA, TMA: NOT DETECTED

## 2018-02-15 ENCOUNTER — Encounter: Payer: Federal, State, Local not specified - PPO | Attending: Obstetrics and Gynecology | Admitting: *Deleted

## 2018-02-15 DIAGNOSIS — R636 Underweight: Secondary | ICD-10-CM | POA: Insufficient documentation

## 2018-02-15 DIAGNOSIS — E46 Unspecified protein-calorie malnutrition: Secondary | ICD-10-CM

## 2018-02-15 DIAGNOSIS — F509 Eating disorder, unspecified: Secondary | ICD-10-CM

## 2018-02-15 DIAGNOSIS — Z713 Dietary counseling and surveillance: Secondary | ICD-10-CM | POA: Diagnosis not present

## 2018-02-15 NOTE — Progress Notes (Signed)
  appt start time 1600 appt end time 1630  Assessment: Amanda Conley is here for follow up nutrition counseling pertaining to disordered eating.  Didn't check weight as she will be seen by adolescent medicine next week   Not as stressed.  More focused. Energy worse.  Sometimes trouble falling asleep.  Trouble staying asleep.  10 hours sleep but still tired No headaches No dizziness   Taking Remeron and not sure if it's helping.  Mood varies depending on the day. Being around people is stressful, being tired is stressful.  If the food she likes isn't available she gets stressed.  Trying new foods is stressful 6 or 7 out of 10 anxiety.  Doesn't feel that is high  Had appt with therapist but had to cancel due to transportation  States she eats the same time and type of food each day.  Afraid to try new foods.  Afraid she will gain weight   24 hour recall B: french toast, tater tots, apple.  Water S: chips. Water L: sandwich, fries.  water D: [place] steak and cheese.  Water S: apple.  water  Exercise: none   Nutrition Diagnosis: NB-1.5 Disordered eating pattern As related to history of inadequate intake.  As evidenced by BMI/age <5th%.  Intervention: challenged ED voice about gaining weight after 1 meal.  She agreed to try 1 new food/week Will add danish to breakfast and fruit to lunch Will drink 1 Ensure  Monitoring Follow up in 2 weeks

## 2018-03-01 ENCOUNTER — Encounter: Payer: Federal, State, Local not specified - PPO | Attending: Obstetrics and Gynecology | Admitting: *Deleted

## 2018-03-01 DIAGNOSIS — E46 Unspecified protein-calorie malnutrition: Secondary | ICD-10-CM

## 2018-03-01 DIAGNOSIS — F5001 Anorexia nervosa, restricting type: Secondary | ICD-10-CM

## 2018-03-01 DIAGNOSIS — Z713 Dietary counseling and surveillance: Secondary | ICD-10-CM | POA: Insufficient documentation

## 2018-03-01 DIAGNOSIS — R636 Underweight: Secondary | ICD-10-CM | POA: Insufficient documentation

## 2018-03-01 NOTE — Progress Notes (Signed)
appt start time 1715 appt end time 1800  Assessment: Amanda Conley is here for follow up nutrition counseling pertaining to disordered eating.  Didn't check weight as she will be seen by adolescent medicine next week  Sleeping well.  Good energy Lower stress.  Good mood.  Taking Remeron and that is going well. Forgets to take it 2 times a week. Headaches daily.  Takes tylenol and that helps some.  Really bad once a week.  Sees christy next week Thinks she is drinking enough Thinks eating is going really well  Still hasn't seen therapist- needs to pay something   Eating is easier.  More hungry.  Feels good about that.  .  Doesn't feel as guilty as before.  Some anxiety, distracts herself to get through that.  Feels guilty about more dense food.  Seeing fat makes her think she will get fat No GI distress   24 hour recall B: cereal, 2 mini waffles, grits, potatoes. Water S: craisins.  water L: burger with fries, cupcake. Water S: pasta with meat sauce. Water D: 2 hotdogs, 2 apples, 2 bag chips.  Water S: ice pop.,  Pizza. Water   Exercise: none   Nutrition Diagnosis: NB-1.5 Disordered eating pattern As related to history of inadequate intake.  As evidenced by BMI/age <5th%.  Intervention: challenged ED voice.  Discussed sexism and racism in diet culture and beauty standards.  Encouraged her to live life on her own terms.  Praised progress.  Keep it up!  Monitoring Follow up in 2 weeks

## 2018-03-10 ENCOUNTER — Encounter: Payer: Self-pay | Admitting: Family

## 2018-03-10 ENCOUNTER — Ambulatory Visit (INDEPENDENT_AMBULATORY_CARE_PROVIDER_SITE_OTHER): Payer: Federal, State, Local not specified - PPO | Admitting: Family

## 2018-03-10 VITALS — BP 102/73 | HR 100 | Ht 65.0 in | Wt 96.6 lb

## 2018-03-10 DIAGNOSIS — Z975 Presence of (intrauterine) contraceptive device: Secondary | ICD-10-CM

## 2018-03-10 NOTE — Progress Notes (Signed)
THIS RECORD MAY CONTAIN CONFIDENTIAL INFORMATION THAT SHOULD NOT BE RELEASED WITHOUT REVIEW OF THE SERVICE PROVIDER.  Adolescent Medicine Consultation Follow-Up Visit Amanda Conley  is a 22 y.o. female referred by Almon Hercules, MD here today for follow-up regarding IUD string check.      Last seen in Adolescent Medicine Clinic on 02/09/18 for Mirena insertion.   Chief Complaint  Patient presents with  . Follow-up    HPI:    -having intermittent spotting/bleeding since insertion.  -no cramping or pain since insertion -no vaginal discharge changes, lesions or other concerns  Review of Systems  Constitutional: Negative for chills and fever.  Gastrointestinal: Negative for abdominal pain and nausea.  Genitourinary: Negative for dysuria and frequency.  Musculoskeletal: Negative for myalgias.  Skin: Negative for rash.  Neurological: Negative for dizziness and headaches.    No LMP recorded. Allergies  Allergen Reactions  . Lamotrigine Rash   Outpatient Medications Prior to Visit  Medication Sig Dispense Refill  . acetaminophen (TYLENOL) 325 MG tablet Take 325-650 mg by mouth 2 (two) times daily as needed for moderate pain or headache. Reported on 05/14/2016    . cyclobenzaprine (FLEXERIL) 10 MG tablet Take 1 tablet (10 mg total) by mouth daily. Take 3-4 hours before your appointment. 1 tablet 0  . mirtazapine (REMERON) 15 MG tablet TAKE 1 TABLET(15 MG) BY MOUTH AT BEDTIME 90 tablet 0  . norethindrone-ethinyl estradiol (JUNEL FE 1/20) 1-20 MG-MCG tablet TK 1 T PO QD UTD 3 Package 4   Facility-Administered Medications Prior to Visit  Medication Dose Route Frequency Provider Last Rate Last Dose  . ibuprofen (ADVIL,MOTRIN) tablet 800 mg  800 mg Oral Daily Millican, Christy, NP   800 mg at 02/09/18 1510     Patient Active Problem List   Diagnosis Date Noted  . Adjustment disorder with mixed anxiety and depressed mood 02/19/2016  . Eating disorder   . Malnutrition compromising  bodily function (HCC)   . DRESS syndrome 01/21/2016   Physical Exam:  Vitals:   03/10/18 1110  BP: 102/73  Pulse: 100  Weight: 96 lb 9.6 oz (43.8 kg)  Height: 5\' 5"  (1.651 m)   BP 102/73   Pulse 100   Ht 5\' 5"  (1.651 m)   Wt 96 lb 9.6 oz (43.8 kg)   BMI 16.08 kg/m  Body mass index: body mass index is 16.08 kg/m. Blood pressure percentiles are not available for patients who are 18 years or older.   Physical Exam  Constitutional: She appears well-developed and well-nourished. No distress.  Eyes: Pupils are equal, round, and reactive to light. EOM are normal. No scleral icterus.  Cardiovascular: Normal rate.  Pulmonary/Chest: Effort normal and breath sounds normal.  Abdominal: Soft. There is no tenderness. There is no guarding.  Genitourinary: Vagina normal and uterus normal.  Genitourinary Comments: Strings visible at os  Scant red/brown blood noted from os    Musculoskeletal: Normal range of motion. She exhibits no edema or tenderness.  Neurological: She is alert.  Skin: Skin is warm and dry. No rash noted.  Psychiatric: She has a normal mood and affect.  Nursing note and vitals reviewed.   Assessment/Plan: 1. IUD (intrauterine device) in place -reassurance given re: bleeding/spotting -return precautions reviewed (pain with intercourse, abnormal, heavy bleeding, discharge changes, lesions, pelvic/abdominal pain) -follow up for IUD is now PRN   Follow-up:  As needed   Medical decision-making:  >15 minutes spent face to face with patient with more than 50% of appointment spent  discussing diagnosis, management, follow-up, and reviewing IUD efficacy, bleeding profile and return precautions as listed above.

## 2018-03-14 ENCOUNTER — Encounter: Payer: Federal, State, Local not specified - PPO | Admitting: *Deleted

## 2018-03-14 ENCOUNTER — Ambulatory Visit: Payer: Federal, State, Local not specified - PPO | Admitting: *Deleted

## 2018-03-14 DIAGNOSIS — R636 Underweight: Secondary | ICD-10-CM | POA: Diagnosis not present

## 2018-03-14 DIAGNOSIS — Z713 Dietary counseling and surveillance: Secondary | ICD-10-CM | POA: Diagnosis not present

## 2018-03-14 NOTE — Progress Notes (Signed)
appt start time 1715 appt end time 1800  Assessment: Amanda Conley is here for follow up nutrition counseling pertaining to disordered eating.  Weight at adolescent medicine visit was stable.  She is not restoring.  Not in therapy for months.  Is [facility]  Family religious, but she isn't.  Last day of classes is next Wednesday.  Will be taking more classes this summer.    Stress ok.  Sleeping ok.  Good energy.   Doesn't forget Remeron.  No issues.   Headaches some- 1 or 2 times a week.  adequate fluids Eating was fine, this weekend got funky, but getting better now.  Had multiple panic attacks.  - couldn't breathe, head was spinning, crying, everything felt heavy.  Takes awhile to calm down  This affected her eating.  Ate breakfast, but didn't leave her dorm after that.  Did order some lunch, but had multiple panic attacks all day    24 hour recall Today Cereal, fries.  Water PB and J, oreos, popcorn.  water   Exercise: none   Nutrition Diagnosis: NB-1.5 Disordered eating pattern As related to history of inadequate intake.  As evidenced by BMI/age <5th%.  Intervention: challenged ED voice.  Discussed sexism and racism of beauty standards.  Discussed ancestral images in her body.  Discussed what it takes to recover.  Discussed panic attacks with Christy via skype who prescribed hydroxyzine.    Monitoring Follow up in 2 weeks

## 2018-04-04 ENCOUNTER — Ambulatory Visit: Payer: Federal, State, Local not specified - PPO | Admitting: *Deleted

## 2018-04-04 ENCOUNTER — Encounter: Payer: Federal, State, Local not specified - PPO | Attending: Obstetrics and Gynecology | Admitting: *Deleted

## 2018-04-04 DIAGNOSIS — R636 Underweight: Secondary | ICD-10-CM | POA: Diagnosis not present

## 2018-04-04 DIAGNOSIS — Z713 Dietary counseling and surveillance: Secondary | ICD-10-CM | POA: Diagnosis not present

## 2018-04-04 NOTE — Patient Instructions (Addendum)
Try 5 mg Melatonin to sleep  Books:   Life Without Ed  Body Positivity Power  Not All Black Girls Know How to Eat  Embody Podcasts:  Nutrition Matters  Love, Food  Dietitians Unplugged  Every Body Poscast  3 meals and 3 snacks.  If needed use a Boost

## 2018-04-04 NOTE — Progress Notes (Signed)
appt start time 1600 appt end time 1700  Assessment: Nicloe is here for follow up nutrition counseling pertaining to disordered eating.  finished spring semester last week and starts summer school tomorrow  Is getting schedule with counseling center over the summer Going to [place] at the end of the month Trouble falling asleep: 1-2  Hours to fall asleep.  Takes Remeron each night around 9 Good energy No headaches Lightheadedness daily- when she moves too much No stomachaches. Normal BM   Doesn't get hungry until the afternoon    24 hour recall chicken terryaki with rice.  Water Frozen meal: ribs and mashed potates and corn.  Water Salt and vinegar chips Chips ahoy Broccoli and cheese.  Water McChicken.  Blue raspberry slushie   Exercise: none   Nutrition Diagnosis: NB-1.5 Disordered eating pattern As related to history of inadequate intake.  As evidenced by BMI/age <5th%.  Intervention: challenged ED voice.   Discussed what it takes to recover.  Try 5 mg Melatonin to sleep  Books:   Life Without Ed  Body Positivity Power  Not All Black Girls Know How to Eat  Embody Podcasts:  Nutrition Matters  Love, Food  Dietitians Unplugged  Every Body Poscast  3 meals and 3 snacks.  If needed use a Boost  Monitoring Follow up in 2 weeks

## 2018-04-11 ENCOUNTER — Ambulatory Visit: Payer: Federal, State, Local not specified - PPO | Admitting: Family

## 2018-04-12 ENCOUNTER — Ambulatory Visit (INDEPENDENT_AMBULATORY_CARE_PROVIDER_SITE_OTHER): Payer: Federal, State, Local not specified - PPO | Admitting: Family

## 2018-04-12 ENCOUNTER — Encounter: Payer: Self-pay | Admitting: Family

## 2018-04-12 VITALS — BP 114/70 | HR 90 | Ht 64.25 in | Wt 94.0 lb

## 2018-04-12 DIAGNOSIS — Z975 Presence of (intrauterine) contraceptive device: Secondary | ICD-10-CM

## 2018-04-12 DIAGNOSIS — F4323 Adjustment disorder with mixed anxiety and depressed mood: Secondary | ICD-10-CM

## 2018-04-12 DIAGNOSIS — F5001 Anorexia nervosa, restricting type: Secondary | ICD-10-CM

## 2018-04-12 DIAGNOSIS — Z1389 Encounter for screening for other disorder: Secondary | ICD-10-CM

## 2018-04-12 LAB — POCT URINALYSIS DIPSTICK
BILIRUBIN UA: 1
Blood, UA: 2
GLUCOSE UA: NEGATIVE
KETONES UA: NEGATIVE
NITRITE UA: POSITIVE
PROTEIN UA: POSITIVE — AB
SPEC GRAV UA: 1.015 (ref 1.010–1.025)
Urobilinogen, UA: 1 E.U./dL
pH, UA: 8 (ref 5.0–8.0)

## 2018-04-12 NOTE — Progress Notes (Signed)
THIS RECORD MAY CONTAIN CONFIDENTIAL INFORMATION THAT SHOULD NOT BE RELEASED WITHOUT REVIEW OF THE SERVICE PROVIDER.  Adolescent Medicine Consultation Follow-Up Visit Amanda Conley  is a 22 y.o. female referred by Almon Hercules, MD here today for follow-up regarding DE, adjustment disorder with mixed anxiety and depressed mood, and IUD in place.   Last seen in [facility] on 03/10/18 for IUD string check.  Pertinent Labs? No Growth Chart Viewed? no   History was provided by the patient.  Interpreter? no  PCP Confirmed?  yes  [name]?   yes    Chief Complaint  Patient presents with  . Follow-up    no active concerns    HPI:    -she is taking summer school classes -endorses taking Remeron 15 mg nightly. No missed doses.  -she likes having the IUD, no breakthrough bleeding  Review of Systems  Constitutional: Negative for malaise/fatigue.  Eyes: Negative for double vision.  Respiratory: Negative for shortness of breath.   Cardiovascular: Negative for chest pain and palpitations.  Gastrointestinal: Negative for abdominal pain, constipation, diarrhea, nausea and vomiting.  Genitourinary: Negative for dysuria.  Musculoskeletal: Negative for joint pain and myalgias.  Skin: Negative for rash.  Neurological: Negative for dizziness and headaches.  Endo/Heme/Allergies: Does not bruise/bleed easily.     No LMP recorded. Allergies  Allergen Reactions  . Lamotrigine Rash   Outpatient Medications Prior to Visit  Medication Sig Dispense Refill  . acetaminophen (TYLENOL) 325 MG tablet Take 325-650 mg by mouth 2 (two) times daily as needed for moderate pain or headache. Reported on 05/14/2016    . mirtazapine (REMERON) 15 MG tablet TAKE 1 TABLET(15 MG) BY MOUTH AT BEDTIME 90 tablet 0  . cyclobenzaprine (FLEXERIL) 10 MG tablet Take 1 tablet (10 mg total) by mouth daily. Take 3-4 hours before your appointment. (Patient not taking: Reported on 04/12/2018) 1  tablet 0  . norethindrone-ethinyl estradiol (JUNEL FE 1/20) 1-20 MG-MCG tablet TK 1 T PO QD UTD (Patient not taking: Reported on 04/12/2018) 3 Package 4   Facility-Administered Medications Prior to Visit  Medication Dose Route Frequency Provider Last Rate Last Dose  . ibuprofen (ADVIL,MOTRIN) tablet 800 mg  800 mg Oral Daily Millican, Ariam Mol, NP   800 mg at 02/09/18 1510     Patient Active Problem List   Diagnosis Date Noted  . IUD (intrauterine device) in place 03/10/2018  . Adjustment disorder with mixed anxiety and depressed mood 02/19/2016  . Eating disorder   . Malnutrition compromising bodily function (HCC)     Physical Exam:  Vitals:   04/12/18 1615  BP: 114/70  Pulse: 90  Weight: 94 lb (42.6 kg)  Height: 5' 4.25" (1.632 m)   BP 114/70 (BP Location: Right Arm, Patient Position: Sitting, Cuff Size: Normal)   Pulse 90   Ht 5' 4.25" (1.632 m)   Wt 94 lb (42.6 kg)   BMI 16.01 kg/m  Body mass index: body mass index is 16.01 kg/m. Blood pressure percentiles are not available for patients who are 18 years or older.  Wt Readings from Last 3 Encounters:  04/12/18 94 lb (42.6 kg) (<1 %, Z= -2.39)*  03/10/18 96 lb 9.6 oz (43.8 kg) (2 %, Z= -2.13)*  02/09/18 96 lb 3.2 oz (43.6 kg) (2 %, Z= -2.17)*   * Growth percentiles are based on CDC (Girls, 2-20 Years) data.    Physical Exam  Constitutional: She appears well-developed. No distress.  HENT:  Head: Normocephalic and atraumatic.  Neck: Normal range of motion. Neck supple.  Cardiovascular: Normal rate and regular rhythm.  No murmur heard. Pulmonary/Chest: Effort normal and breath sounds normal.  Musculoskeletal: She exhibits no edema.  Lymphadenopathy:    She has no cervical adenopathy.  Neurological: She is alert.  Skin: Skin is warm and dry. No rash noted.    Assessment/Plan: 1. Anorexia nervosa, restricting type -she is down 2 lbs today although denies symptoms, unsure if related to end of semester  stress/exams -will continue to monitor  -continue with remeron 15 mg nightly   2. Adjustment disorder with mixed anxiety and depressed mood -as above   3. IUD (intrauterine device) in place -continue, return precautions given   4. Screening for genitourinary condition -positive nitrites, will treat  - POCT urinalysis dipstick  Follow-up:   Return in one month or sooner as needed  Medical decision-making:  >15 minutes spent face to face with patient with more than 50% of appointment spent discussing diagnosis, management, follow-up, and reviewing plan of care as above.

## 2018-04-14 ENCOUNTER — Encounter: Payer: Self-pay | Admitting: Family

## 2018-04-14 MED ORDER — NITROFURANTOIN MONOHYD MACRO 100 MG PO CAPS: 100.0000 mg | ORAL_CAPSULE | Freq: Two times a day (BID) | ORAL | 0 refills | Status: DC

## 2018-04-18 ENCOUNTER — Ambulatory Visit: Payer: Federal, State, Local not specified - PPO | Admitting: *Deleted

## 2018-05-16 ENCOUNTER — Encounter: Payer: Self-pay | Admitting: Family

## 2018-05-16 ENCOUNTER — Ambulatory Visit (INDEPENDENT_AMBULATORY_CARE_PROVIDER_SITE_OTHER): Payer: Federal, State, Local not specified - PPO | Admitting: Family

## 2018-05-16 VITALS — BP 107/75 | HR 108 | Ht 64.17 in | Wt 94.4 lb

## 2018-05-16 DIAGNOSIS — N3000 Acute cystitis without hematuria: Secondary | ICD-10-CM | POA: Diagnosis not present

## 2018-05-16 DIAGNOSIS — Z1389 Encounter for screening for other disorder: Secondary | ICD-10-CM | POA: Diagnosis not present

## 2018-05-16 DIAGNOSIS — J029 Acute pharyngitis, unspecified: Secondary | ICD-10-CM | POA: Diagnosis not present

## 2018-05-16 LAB — POCT URINALYSIS DIPSTICK
Blood, UA: 250
GLUCOSE UA: POSITIVE — AB
KETONES UA: NEGATIVE
Nitrite, UA: POSITIVE
PROTEIN UA: POSITIVE — AB
SPEC GRAV UA: 1.02 (ref 1.010–1.025)
Urobilinogen, UA: 4 E.U./dL — AB
pH, UA: 5 (ref 5.0–8.0)

## 2018-05-16 MED ORDER — PHENAZOPYRIDINE HCL 200 MG PO TABS: 200.0000 mg | ORAL_TABLET | Freq: Three times a day (TID) | ORAL | 0 refills | Status: DC | PRN

## 2018-05-16 MED ORDER — NITROFURANTOIN MONOHYD MACRO 100 MG PO CAPS: 100.0000 mg | ORAL_CAPSULE | Freq: Two times a day (BID) | ORAL | 0 refills | Status: DC

## 2018-05-16 NOTE — Patient Instructions (Signed)
Take Macrobid twice daily and Pyridium as needed for bladder spasms or pain.  I will call you or send you a My Chart message with results from urine culture.

## 2018-05-16 NOTE — Progress Notes (Signed)
History was provided by the patient.  Amanda Conley is a 22 y.o. female who is here for anorexia nervosa, restricting type. At last visit on 04/12/18, she was continued on Remeron 15 mg nightly.   PCP confirmed? Yes.    Almon Hercules, MD  HPI:  -She is happy with IUD.  -No spotting or concerning bleeding.  -She takes Remeron 15 mg at night with no missed doses.  -She denies SI/HI or adverse effects of medicines.  -She endorses things going well at school; safe in relationships.  -Sore throat, no cough, no sick contacts or recent travel.    Review of Systems  Constitutional: Negative for malaise/fatigue.  Eyes: Negative for double vision.  Respiratory: Negative for shortness of breath.   Cardiovascular: Negative for chest pain and palpitations.  Gastrointestinal: Negative for abdominal pain, constipation, diarrhea and vomiting.  Genitourinary: Positive for frequency and hematuria. Negative for dysuria.  Musculoskeletal: Negative for joint pain and myalgias.  Skin: Negative for rash.  Neurological: Negative for dizziness and headaches.  Psychiatric/Behavioral: Negative for substance abuse and suicidal ideas.    Patient Active Problem List   Diagnosis Date Noted  . IUD (intrauterine device) in place 03/10/2018  . Adjustment disorder with mixed anxiety and depressed mood 02/19/2016  . Eating disorder   . Malnutrition compromising bodily function [facility])     Current Outpatient Medications on File Prior to Visit  Medication Sig Dispense Refill  . acetaminophen (TYLENOL) 325 MG tablet Take 325-650 mg by mouth 2 (two) times daily as needed for moderate pain or headache. Reported on 05/14/2016    . cyclobenzaprine (FLEXERIL) 10 MG tablet Take 1 tablet (10 mg total) by mouth daily. Take 3-4 hours before your appointment. (Patient not taking: Reported on 04/12/2018) 1 tablet 0  . mirtazapine (REMERON) 15 MG tablet TAKE 1 TABLET(15 MG) BY MOUTH AT BEDTIME 90 tablet 0  . nitrofurantoin,  macrocrystal-monohydrate, (MACROBID) 100 MG capsule Take 1 capsule (100 mg total) by mouth 2 (two) times daily. 10 capsule 0  . norethindrone-ethinyl estradiol (JUNEL FE 1/20) 1-20 MG-MCG tablet TK 1 T PO QD UTD (Patient not taking: Reported on 04/12/2018) 3 Package 4   No current facility-administered medications on file prior to visit.     Allergies  Allergen Reactions  . Lamotrigine Rash    Physical Exam:    Vitals:   05/16/18 1617  Weight: 94 lb 6.4 oz (42.8 kg)  Height: 5' 4.17" (1.63 m)   Wt Readings from Last 3 Encounters:  05/16/18 94 lb 6.4 oz (42.8 kg)  04/12/18 94 lb (42.6 kg) (<1 %, Z= -2.39)*  03/10/18 96 lb 9.6 oz (43.8 kg) (2 %, Z= -2.13)*   * Growth percentiles are based on CDC (Girls, 2-20 Years) data.    Growth percentile SmartLinks can only be used for patients less than 61 years old. No LMP recorded.  Physical Exam  Constitutional: She is oriented to person, place, and time. She appears well-developed and well-nourished. No distress.  Eyes: Pupils are equal, round, and reactive to light. EOM are normal. No scleral icterus.  Neck: Normal range of motion. Neck supple. No thyromegaly present.  Cardiovascular: Normal rate, regular rhythm, normal heart sounds and intact distal pulses.  No murmur heard. Pulmonary/Chest: Effort normal and breath sounds normal.  Abdominal: Soft. There is no tenderness. There is no guarding.  Musculoskeletal: Normal range of motion. She exhibits no edema or tenderness.  No CVT  Lymphadenopathy:    She has no cervical adenopathy.  Neurological: She is alert and oriented to person, place, and time. No cranial nerve deficit.  Skin: Skin is warm and dry. No rash noted.  Psychiatric: She has a normal mood and affect.  Nursing note and vitals reviewed.    Assessment/Plan:  1. Acute cystitis without hematuria -urine dip is postive for protein, nitrites, leuks+4 - will send culture and treat with Macrobid, pyridium for pain -urine  culture sent  2. Sore throat -will screen for strep and gc/c.  - POCT rapid strep A - GC/CT Probe, Amp (Throat)  3. Screening for genitourinary condition -as above  - POCT urinalysis dipstick - Urine Culture

## 2018-05-18 LAB — GC/CHLAMYDIA PROBE, AMP (THROAT)
Chlamydia trachomatis RNA: NOT DETECTED
Neisseria gonorrhoeae RNA: NOT DETECTED

## 2018-05-19 LAB — URINE CULTURE
MICRO NUMBER: 90757699
SPECIMEN QUALITY: ADEQUATE

## 2018-06-02 ENCOUNTER — Encounter: Payer: Self-pay | Admitting: Family

## 2018-06-02 LAB — POCT RAPID STREP A (OFFICE): Rapid Strep A Screen: NEGATIVE

## 2018-07-19 ENCOUNTER — Ambulatory Visit (INDEPENDENT_AMBULATORY_CARE_PROVIDER_SITE_OTHER): Payer: Federal, State, Local not specified - PPO | Admitting: Family

## 2018-07-19 ENCOUNTER — Encounter: Payer: Self-pay | Admitting: Family

## 2018-07-19 VITALS — BP 100/63 | HR 91 | Ht 64.0 in | Wt 96.0 lb

## 2018-07-19 DIAGNOSIS — Z975 Presence of (intrauterine) contraceptive device: Secondary | ICD-10-CM

## 2018-07-19 DIAGNOSIS — F4323 Adjustment disorder with mixed anxiety and depressed mood: Secondary | ICD-10-CM | POA: Diagnosis not present

## 2018-07-19 NOTE — Progress Notes (Signed)
History was provided by the patient.  Amanda Conley is a 22 y.o. female who is here for .   PCP confirmed? Yes.    Lennox Solders, MD  HPI:   -weaned off of Remeron since last OV -took every other day until she stopped with no side effects -not interested in meds at this time -she did summer school and has now started fall semester -going well no concerns   Review of Systems  Constitutional: Negative for malaise/fatigue.  Eyes: Negative for double vision.  Respiratory: Negative for shortness of breath.   Cardiovascular: Negative for chest pain and palpitations.  Gastrointestinal: Negative for abdominal pain, constipation, diarrhea, nausea and vomiting.  Genitourinary: Negative for dysuria.  Musculoskeletal: Negative for joint pain and myalgias.  Skin: Negative for rash.  Neurological: Negative for dizziness and headaches.  Endo/Heme/Allergies: Does not bruise/bleed easily.      Patient Active Problem List   Diagnosis Date Noted  . IUD (intrauterine device) in place 03/10/2018  . Adjustment disorder with mixed anxiety and depressed mood 02/19/2016  . Eating disorder   . Malnutrition compromising bodily function [facility])     Current Outpatient Medications on File Prior to Visit  Medication Sig Dispense Refill  . acetaminophen (TYLENOL) 325 MG tablet Take 325-650 mg by mouth 2 (two) times daily as needed for moderate pain or headache. Reported on 05/14/2016    . cyclobenzaprine (FLEXERIL) 10 MG tablet Take 1 tablet (10 mg total) by mouth daily. Take 3-4 hours before your appointment. (Patient not taking: Reported on 04/12/2018) 1 tablet 0  . mirtazapine (REMERON) 15 MG tablet TAKE 1 TABLET(15 MG) BY MOUTH AT BEDTIME (Patient not taking: Reported on 07/19/2018) 90 tablet 0  . nitrofurantoin, macrocrystal-monohydrate, (MACROBID) 100 MG capsule Take 1 capsule (100 mg total) by mouth 2 (two) times daily. (Patient not taking: Reported on 07/19/2018) 10 capsule 0  . phenazopyridine  (PYRIDIUM) 200 MG tablet Take 1 tablet (200 mg total) by mouth 3 (three) times daily as needed for pain. (Patient not taking: Reported on 07/19/2018) 10 tablet 0   No current facility-administered medications on file prior to visit.     Allergies  Allergen Reactions  . Lamotrigine Rash    Physical Exam:    Vitals:   07/19/18 1458  BP: 100/63  Pulse: 91  Weight: 96 lb (43.5 kg)  Height: 5\' 4"  (1.626 m)    Wt Readings from Last 3 Encounters:  07/19/18 96 lb (43.5 kg)  05/16/18 94 lb 6.4 oz (42.8 kg)  04/12/18 94 lb (42.6 kg) (<1 %, Z= -2.39)*   * Growth percentiles are based on CDC (Girls, 2-20 Years) data.    Growth percentile SmartLinks can only be used for patients less than 63 years old. No LMP recorded.  Physical Exam  Constitutional: She is oriented to person, place, and time. She appears well-developed and well-nourished. No distress.  Eyes: Pupils are equal, round, and reactive to light. EOM are normal. No scleral icterus.  Neck: Normal range of motion. Neck supple. No thyromegaly present.  Cardiovascular: Normal rate, regular rhythm, normal heart sounds and intact distal pulses.  No murmur heard. Pulmonary/Chest: Effort normal and breath sounds normal.  Abdominal: Soft. There is no tenderness. There is no guarding.  Musculoskeletal: Normal range of motion. She exhibits no edema or tenderness.  Lymphadenopathy:    She has no cervical adenopathy.  Neurological: She is alert and oriented to person, place, and time. No cranial nerve deficit.  Skin: Skin is warm  and dry. No rash noted.  Psychiatric: She has a normal mood and affect.  Nursing note and vitals reviewed.    Assessment/Plan: 1. Adjustment disorder with mixed anxiety and depressed mood -has weaned herself off remeron again. Will watch for symptoms of anxiety and depression or eating disorder symptoms return.    2. IUD (intrauterine device) in place -likes method, continue  -condom use reviewed

## 2018-07-19 NOTE — Patient Instructions (Signed)
Return in 3 weeks or sooner as needed.

## 2018-07-21 ENCOUNTER — Encounter: Payer: Self-pay | Admitting: Family

## 2018-10-13 ENCOUNTER — Ambulatory Visit: Payer: Federal, State, Local not specified - PPO | Admitting: Family

## 2018-11-01 ENCOUNTER — Ambulatory Visit (INDEPENDENT_AMBULATORY_CARE_PROVIDER_SITE_OTHER): Payer: Federal, State, Local not specified - PPO | Admitting: Family

## 2018-11-01 ENCOUNTER — Encounter: Payer: Self-pay | Admitting: Family

## 2018-11-01 VITALS — BP 120/77 | HR 91 | Ht 64.37 in | Wt 93.2 lb

## 2018-11-01 DIAGNOSIS — F5001 Anorexia nervosa, restricting type: Secondary | ICD-10-CM

## 2018-11-01 DIAGNOSIS — F50019 Anorexia nervosa, restricting type, unspecified: Secondary | ICD-10-CM

## 2018-11-01 DIAGNOSIS — F4323 Adjustment disorder with mixed anxiety and depressed mood: Secondary | ICD-10-CM | POA: Diagnosis not present

## 2018-11-01 DIAGNOSIS — Z113 Encounter for screening for infections with a predominantly sexual mode of transmission: Secondary | ICD-10-CM | POA: Diagnosis not present

## 2018-11-01 DIAGNOSIS — Z975 Presence of (intrauterine) contraceptive device: Secondary | ICD-10-CM | POA: Diagnosis not present

## 2018-11-01 DIAGNOSIS — Z1389 Encounter for screening for other disorder: Secondary | ICD-10-CM | POA: Diagnosis not present

## 2018-11-01 LAB — POCT URINALYSIS DIPSTICK
Bilirubin, UA: NEGATIVE
GLUCOSE UA: NEGATIVE
KETONES UA: NEGATIVE
NITRITE UA: NEGATIVE
Protein, UA: POSITIVE — AB
RBC UA: NEGATIVE
Spec Grav, UA: 1.02 (ref 1.010–1.025)
UROBILINOGEN UA: NEGATIVE U/dL — AB
pH, UA: 5 (ref 5.0–8.0)

## 2018-11-01 MED ORDER — MIRTAZAPINE 7.5 MG PO TABS: 7.5000 mg | ORAL_TABLET | Freq: Every day | ORAL | 1 refills | Status: DC

## 2018-11-01 NOTE — Patient Instructions (Signed)
Start taking Remeron 7.5 mg nightly.  Return sooner if needed.  I will call you with results from today's visit.

## 2018-11-01 NOTE — Progress Notes (Signed)
History was provided by the patient.  Amanda Conley is a 22 y.o. female who is here for adjustment disorder with mixed anxiety and depressed mood and Anorexia nervosa, restricting type.   PCP confirmed? Yes.    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  -would be most concerned about sleepiness with Remeron restart.  -likes IUD ; having some pelvic pain x 1 week with intercourse  -no vaginal discharge   Review of Systems  Constitutional: Negative for malaise/fatigue.  Eyes: Negative for double vision.  Respiratory: Negative for shortness of breath.   Cardiovascular: Negative for chest pain and palpitations.  Gastrointestinal: Positive for nausea (stomach in knots when she wakes up or after she eats if something greasy). Negative for abdominal pain, constipation, diarrhea and vomiting.  Genitourinary: Negative for dysuria.  Musculoskeletal: Negative for joint pain and myalgias.  Skin: Negative for rash.  Neurological: Negative for dizziness and headaches.  Endo/Heme/Allergies: Does not bruise/bleed easily.     Patient Active Problem List   Diagnosis Date Noted  . IUD (intrauterine device) in place 03/10/2018  . Adjustment disorder with mixed anxiety and depressed mood 02/19/2016  . Eating disorder   . Malnutrition compromising bodily function [facility](HCC)     Current Outpatient Medications on File Prior to Visit  Medication Sig Dispense Refill  . acetaminophen (TYLENOL) 325 MG tablet Take 325-650 mg by mouth 2 (two) times daily as needed for moderate pain or headache. Reported on 05/14/2016    . cyclobenzaprine (FLEXERIL) 10 MG tablet Take 1 tablet (10 mg total) by  mouth daily. Take 3-4 hours before your appointment. (Patient not taking: Reported on 04/12/2018) 1 tablet 0  . mirtazapine (REMERON) 15 MG tablet TAKE 1 TABLET(15 MG) BY MOUTH AT BEDTIME (Patient not taking: Reported on 07/19/2018) 90 tablet 0  . nitrofurantoin, macrocrystal-monohydrate, (MACROBID) 100 MG capsule Take 1 capsule (100 mg total) by mouth 2 (two) times daily. (Patient not taking: Reported on 07/19/2018) 10 capsule 0  . phenazopyridine (PYRIDIUM) 200 MG tablet Take 1 tablet (200 mg total) by mouth 3 (three) times daily as needed for pain. (Patient not taking: Reported on 07/19/2018) 10 tablet 0   No current facility-administered medications on file prior to visit.     Allergies  Allergen Reactions  . Lamotrigine Rash    Physical Exam:    Vitals:   11/01/18 1617 11/01/18 1631  BP: 102/70 120/77  Pulse: 82 91  Weight: 93 lb 3.2 oz (42.3 kg)   Height: 5' 4.37" (1.635 m)    Wt Readings from Last 3 Encounters:  11/01/18 93 lb 3.2 oz (42.3 kg)  07/19/18 96 lb (43.5 kg)  05/16/18 94 lb 6.4 oz (42.8 kg)    Growth percentile SmartLinks can only be used for patients less than 20 years old. No LMP recorded.  Physical Exam Vitals signs and nursing note reviewed.  Constitutional:      General: She is not in acute distress.    Appearance: She is well-developed.  Eyes:     General: No scleral icterus.    Extraocular Movements: Extraocular movements intact.     Pupils: Pupils are  equal, round, and reactive to light.  Neck:     Thyroid: No thyromegaly.  Cardiovascular:     Rate and Rhythm: Normal rate and regular rhythm.     Heart sounds: No murmur.  Pulmonary:     Breath sounds: Normal breath sounds.  Abdominal:     Palpations: Abdomen is soft. There is no mass.     Tenderness: There is no abdominal tenderness. There is no guarding.  Genitourinary:    General: Normal vulva.     Vagina: No vaginal discharge.     Rectum: Normal.     Comments: Thick white discharge No CMT  or adnexal tenderness  String visible  Lymphadenopathy:     Cervical: No cervical adenopathy.  Skin:    General: Skin is warm.     Findings: No rash.  Neurological:     Mental Status: She is alert.      Assessment/Plan: 1. Adjustment disorder with mixed anxiety and depressed mood -restart remeron 7.5 - will see if this lower dose helps with sleep but is not too sedating  2. Anorexia nervosa, restricting type -reviewed that weight was down today and with increased anxiety, will restart remeron as above -check in via My Chart, return sooner if needed -BBW and return precautions reviewed  3. Screening for genitourinary condition -positive for pretein, trace leuks - POCT urinalysis dipstick  4. IUD (intrauterine device) in place -strings visible   5. Routine screening for STI (sexually transmitted infection) -screened for infection, will alert her with results - C. trachomatis/N. gonorrhoeae RNA - WET PREP BY MOLECULAR PROBE

## 2018-11-02 LAB — WET PREP BY MOLECULAR PROBE
Candida species: DETECTED — AB
Gardnerella vaginalis: NOT DETECTED
MICRO NUMBER:: 91483993
SPECIMEN QUALITY:: ADEQUATE
TRICHOMONAS VAG: NOT DETECTED

## 2018-11-02 LAB — C. TRACHOMATIS/N. GONORRHOEAE RNA
C. trachomatis RNA, TMA: NOT DETECTED
N. gonorrhoeae RNA, TMA: NOT DETECTED

## 2018-11-03 ENCOUNTER — Encounter: Payer: Self-pay | Admitting: Family

## 2018-11-03 ENCOUNTER — Other Ambulatory Visit: Payer: Self-pay | Admitting: Family

## 2018-11-03 MED ORDER — FLUCONAZOLE 150 MG PO TABS: 150.0000 mg | ORAL_TABLET | Freq: Every day | ORAL | 1 refills | Status: DC

## 2018-11-29 ENCOUNTER — Ambulatory Visit: Payer: Federal, State, Local not specified - PPO | Admitting: Family

## 2018-12-12 ENCOUNTER — Encounter: Payer: Self-pay | Admitting: Family

## 2018-12-12 ENCOUNTER — Ambulatory Visit (INDEPENDENT_AMBULATORY_CARE_PROVIDER_SITE_OTHER): Payer: No Typology Code available for payment source | Admitting: Family

## 2018-12-12 VITALS — BP 112/74 | HR 87 | Ht 63.48 in | Wt 98.0 lb

## 2018-12-12 DIAGNOSIS — R109 Unspecified abdominal pain: Secondary | ICD-10-CM

## 2018-12-12 DIAGNOSIS — Z3202 Encounter for pregnancy test, result negative: Secondary | ICD-10-CM

## 2018-12-12 DIAGNOSIS — Z975 Presence of (intrauterine) contraceptive device: Secondary | ICD-10-CM

## 2018-12-12 DIAGNOSIS — Z1389 Encounter for screening for other disorder: Secondary | ICD-10-CM

## 2018-12-12 DIAGNOSIS — F4323 Adjustment disorder with mixed anxiety and depressed mood: Secondary | ICD-10-CM | POA: Diagnosis not present

## 2018-12-12 LAB — POCT URINALYSIS DIPSTICK
Bilirubin, UA: NEGATIVE
GLUCOSE UA: NEGATIVE
Ketones, UA: NEGATIVE
Nitrite, UA: NEGATIVE
Protein, UA: NEGATIVE
RBC UA: NEGATIVE
Spec Grav, UA: 1.01 (ref 1.010–1.025)
Urobilinogen, UA: NEGATIVE E.U./dL — AB
pH, UA: 7 (ref 5.0–8.0)

## 2018-12-12 LAB — POCT URINE PREGNANCY: Preg Test, Ur: NEGATIVE

## 2018-12-12 MED ORDER — OMEPRAZOLE 20 MG PO CPDR: 20.0000 mg | DELAYED_RELEASE_CAPSULE | Freq: Every day | ORAL | 0 refills | Status: DC

## 2018-12-12 NOTE — Patient Instructions (Signed)
Your urine tests are negative today so we are going to start a medciation for heartburn to see if that improves your belly pain  Please come back in one month so we can talk about whether the medicine is working and potentially try another medication

## 2018-12-12 NOTE — Progress Notes (Signed)
THIS RECORD MAY CONTAIN CONFIDENTIAL INFORMATION THAT SHOULD NOT BE RELEASED WITHOUT REVIEW OF THE SERVICE PROVIDER.  Adolescent Medicine Consultation Follow-Up Visit Amanda Conley  is a 23 y.o. female referred by Lennox Solders, MD here today for follow-up regarding adjustment disorder with mixed anxiety and depressive symptoms and disordered eating  Last seen in Adolescent Medicine Clinic on 12/11 for the above.  Plan at last visit included re-starting Remeron   - Pertinent Labs? No - Vitals Viewed, including weight? yes   History was provided by the patient.  PCP Confirmed?  yes   Chief Complaint  Patient presents with  . Follow-up    HPI:    Amanda Conley reports that things are significantly improved with Remeron, particularly her sleep. She takes the medication at night and is able to fall asleep within a few hours. She receives at least 9 hours of sleep at night. She denies side effects, including daytime sleepiness with the medication  She reports her mood as "normal". She denies any issues with worrying or sadness now.   She reports improvement in pelvic pain since last visit as well. She currently denies any pelvic pain or vaginal discharge. She is reports new abdominal pain in the morning for the last month. Pain is periumbilical, does not radiate and only occurs in the morning. She denies sharp pain and reports it is more like a feeling of uncomfortable fullness. Sometimes she has associated nausea but she denies vomiting. She is unsure how eating relates to pain. Pain will last for 1-2 hours and then go ahead. She is having pain daily.   She denies regurgitation. She stools once every 1-2 days. Stools are soft and not painful. She denies clogging the toilet or blood on toilet paper.   No LMP recorded. Allergies  Allergen Reactions  . Lamotrigine Rash   Outpatient Medications Prior to Visit  Medication Sig Dispense Refill  . mirtazapine (REMERON) 7.5 MG tablet Take 1  tablet (7.5 mg total) by mouth at bedtime. 30 tablet 1  . acetaminophen (TYLENOL) 325 MG tablet Take 325-650 mg by mouth 2 (two) times daily as needed for moderate pain or headache. Reported on 05/14/2016    . cyclobenzaprine (FLEXERIL) 10 MG tablet Take 1 tablet (10 mg total) by mouth daily. Take 3-4 hours before your appointment. (Patient not taking: Reported on 04/12/2018) 1 tablet 0  . fluconazole (DIFLUCAN) 150 MG tablet Take 1 tablet (150 mg total) by mouth daily. (Patient not taking: Reported on 12/12/2018) 1 tablet 1  . nitrofurantoin, macrocrystal-monohydrate, (MACROBID) 100 MG capsule Take 1 capsule (100 mg total) by mouth 2 (two) times daily. (Patient not taking: Reported on 07/19/2018) 10 capsule 0  . phenazopyridine (PYRIDIUM) 200 MG tablet Take 1 tablet (200 mg total) by mouth 3 (three) times daily as needed for pain. (Patient not taking: Reported on 07/19/2018) 10 tablet 0   No facility-administered medications prior to visit.      Patient Active Problem List   Diagnosis Date Noted  . IUD (intrauterine device) in place 03/10/2018  . Adjustment disorder with mixed anxiety and depressed mood 02/19/2016  . Eating disorder   . Malnutrition compromising bodily function (HCC)    The following portions of the patient's history were reviewed and updated as appropriate: allergies, current medications, past medical history and problem list.  Physical Exam:  Vitals:   12/12/18 1532  BP: 112/74  Pulse: 87  Weight: 98 lb (44.5 kg)  Height: 5' 3.48" (1.612 m)   BP  112/74   Pulse 87   Ht 5' 3.48" (1.612 m)   Wt 98 lb (44.5 kg)   BMI 17.10 kg/m  Body mass index: body mass index is 17.1 kg/m. Growth percentile SmartLinks can only be used for patients less than 48 years old.  Physical Exam General: well-nourished, in NAD HEENT: /AT, PERRL, EOMI, no conjunctival injection, mucous membranes moist, oropharynx clear Neck: full ROM, supple Lymph nodes: no cervical  lymphadenopathy Chest: lungs CTAB, no nasal flaring or grunting, no increased work of breathing, no retractions Heart: RRR, no m/r/g Abdomen: soft, nontender, ondistended, no hepatosplenomegaly Extremities: Cap refill <3s Musculoskeletal: full ROM in 4 extremities, moves all extremities equally Neurological: alert and active Skin: no rash    Assessment/Plan:  1. Adjustment Disorder with mixed anxiety and depressive symptoms - Continue Remeron 7.5 mg - Reviewed side effects  2. IUD (Intrauterine device) in place - Continue to recommend at this time - Reviewed results of labs at last visit for pelvic pain; no additional evaluation at this time  3. Abdominal Pain - given absence of constipation symptoms, differential could include functional abdominal pain, heartburn / reflux - Performed POC pregnancy test - negative - Will try a one-month trial of omeprazole 20 mg daily; consider advance to BID if only having partial improvement - Return in 1 month  Follow-up:  Return in about 1 month (around 01/12/2019) for abdominal pain f/u.   Medical decision-making:  >25 minutes spent face to face with patient with more than 50% of appointment spent discussing diagnosis, management, follow-up, and reviewing of adjustment disorder with mixed anxiety and depressive symptoms, IUD monitoring and abdominal pain.

## 2018-12-23 ENCOUNTER — Other Ambulatory Visit: Payer: Self-pay | Admitting: Family

## 2019-01-17 ENCOUNTER — Ambulatory Visit: Payer: No Typology Code available for payment source | Admitting: Family

## 2019-02-17 ENCOUNTER — Other Ambulatory Visit: Payer: Self-pay | Admitting: Pediatrics

## 2019-04-05 ENCOUNTER — Ambulatory Visit (INDEPENDENT_AMBULATORY_CARE_PROVIDER_SITE_OTHER)
Admission: RE | Admit: 2019-04-05 | Discharge: 2019-04-05 | Disposition: A | Discharge status: Home / Self Care | Payer: Self-pay | Source: Ambulatory Visit

## 2019-04-05 ENCOUNTER — Telehealth: Payer: Self-pay

## 2019-04-05 ENCOUNTER — Inpatient Hospital Stay
Admission: RE | Admit: 2019-04-05 | Discharge: 2019-04-05 | Disposition: A | Discharge status: Home / Self Care | Payer: Self-pay | Source: Ambulatory Visit

## 2019-04-05 DIAGNOSIS — R079 Chest pain, unspecified: Secondary | ICD-10-CM

## 2019-04-05 MED ORDER — IBUPROFEN 600 MG PO TABS: 600.0000 mg | ORAL_TABLET | Freq: Four times a day (QID) | ORAL | 0 refills | Status: AC | PRN

## 2019-04-05 NOTE — Discharge Instructions (Signed)
I believe that your chest pain is most likely musculoskeletal You can do ibuprofen 600 mg every 6 hours as needed for pain inflammation If your symptoms do not improve or worsen over the next 24 to 40 hours he will need to be seen in person for evaluation

## 2019-04-05 NOTE — ED Provider Notes (Signed)
Virtual Visit via Video Note:  Amanda Conley  initiated request for Telemedicine visit with [facility]. I connected with Amanda Conley  on 04/05/2019 at 11:51 AM  for a synchronized telemedicine visit using a video enabled HIPPA compliant telemedicine application. I verified that I am speaking with Amanda Conley  using two identifiers. Amanda Aris, NP  was physically located in a [facility] Urgent care site and Amanda Conley was located at a different location.   The limitations of evaluation and management by telemedicine as well as the availability of in-person appointments were discussed. Patient was informed that she  may incur a bill ( including co-pay) for this virtual visit encounter. Amanda Conley  expressed understanding and gave verbal consent to proceed with virtual visit.     History of Present Illness:Amanda Conley  is a 23 y.o. female presents with chest tightness that has been constant since yesterday.  Reports that the pain is in the middle of her chest.  She has had tingling in the left and right arm at times.  Nothing current.  Nothing makes it better or worse.  She just woke up this way.  Does not get worse with exertion and denies any shortness of breath, palpitations, cough or fevers.  She does not smoke.  She took aspirin for her symptoms and this did not help.  Denies any recent heavy lifting or strenuous exercise.  No history of DVT or PE.  No recent traveling or sick contacts.  No known COVID exposure.  Past Medical History:  Diagnosis Date  . Anxiety     Allergies  Allergen Reactions  . Lamotrigine Rash        Observations/Objective: GENERAL APPEARANCE: Well developed, well nourished, alert and cooperative, and appears to be in no acute distress. HEAD: normocephalic. Non labored breathing, no dyspnea or distress Skin: Skin normal color  MS: Had patient outstretch arms and some pain was elicited with this movement.  There is also some pain elicited  with taking a deep breath.  No pain with palpation of the chest. PSYCHIATRIC: The mental examination revealed the patient was oriented to person, place, and time. The patient was able to demonstrate good judgement and reason, without hallucinations, abnormal affect or abnormal behaviors during the examination. Patient is not suicidal   Assessment and Plan: Based on symptoms and low risk factors patient symptoms most likely musculoskeletal chest pain.  Will have her trial ibuprofen, 600 mg every 6 hours.  Recommended that if her symptoms do not improve or worsen over the next couple days that she will need to be seen in person.   Follow Up Instructions: Follow up as needed for continued or worsening symptoms    I discussed the assessment and treatment plan with the patient. The patient was provided an opportunity to ask questions and all were answered. The patient agreed with the plan and demonstrated an understanding of the instructions.   The patient was advised to call back or seek an in-person evaluation if the symptoms worsen or if the condition fails to improve as anticipated.      Amanda Aris, NP  04/05/2019 11:51 AM         Amanda Aris, NP 04/06/19 1603

## 2019-06-08 ENCOUNTER — Other Ambulatory Visit: Payer: Self-pay | Admitting: Pediatrics

## 2019-08-05 ENCOUNTER — Other Ambulatory Visit: Payer: Self-pay | Admitting: Family

## 2019-10-07 ENCOUNTER — Other Ambulatory Visit: Payer: Self-pay | Admitting: Family

## 2019-12-02 ENCOUNTER — Other Ambulatory Visit: Payer: Self-pay | Admitting: Family

## 2019-12-20 ENCOUNTER — Telehealth: Payer: Self-pay

## 2019-12-20 NOTE — Telephone Encounter (Signed)
Received notification from [facility] that patient has been non-compliant with medication Mirtazapine 7.5 mg tabs. Pt needs a follow up to discuss. Routing to [place] to schedule.

## 2019-12-21 NOTE — Telephone Encounter (Signed)
Called to schedule follow up appointment, patient did not answer. No VM set up.

## 2020-06-09 ENCOUNTER — Encounter (HOSPITAL_COMMUNITY): Payer: Self-pay

## 2020-06-09 ENCOUNTER — Ambulatory Visit (HOSPITAL_COMMUNITY)
Admission: RE | Admit: 2020-06-09 | Discharge: 2020-06-09 | Disposition: A | Discharge status: Home / Self Care | Payer: 59 | Source: Ambulatory Visit | Attending: Family Medicine | Admitting: Family Medicine

## 2020-06-09 ENCOUNTER — Other Ambulatory Visit: Payer: Self-pay

## 2020-06-09 VITALS — BP 116/73 | HR 99 | Temp 98.5°F | Resp 18 | Ht 64.0 in | Wt 93.0 lb

## 2020-06-09 DIAGNOSIS — F419 Anxiety disorder, unspecified: Secondary | ICD-10-CM

## 2020-06-09 MED ORDER — MIRTAZAPINE 7.5 MG PO TABS: 7.5000 mg | ORAL_TABLET | Freq: Every day | ORAL | 0 refills | Status: DC

## 2020-06-09 NOTE — ED Provider Notes (Signed)
[facility]    CSN: [id number] Arrival date & time: 06/09/20  1310      History   Chief Complaint Chief Complaint  Patient presents with   Anxiety    HPI Amanda Conley is a 24 y.o. adult.   Amanda Conley presents with complaints of increased anxiety recently. States has been treated for anxiety in the past, but end of last year she had been feeling improved and no longer had refills available therefore stopped taking prescribed remeron. Over the past month, and especially the past few days she has felt more anxiety. It has felt hard for her to get out of bed, she states she has also been crying more than usual. Denies thoughts of self harm or harm to others. She has been working at a summer camp. She does not have a PCP as she had been seen with pediatrics previously. She had tried one other medication for anxiety but had been allergic to it. No specific known anxiety trigger/ life event.     ROS per HPI, negative if not otherwise mentioned.      Past Medical History:  Diagnosis Date   Anxiety     Patient Active Problem List   Diagnosis Date Noted   IUD (intrauterine device) in place 03/10/2018   Adjustment disorder with mixed anxiety and depressed mood 02/19/2016   Eating disorder    Malnutrition compromising bodily function (HCC)     History reviewed. No pertinent surgical history.  OB History   No obstetric history on file.      Home Medications    Prior to Admission medications   Medication Sig Start Date End Date Taking? Authorizing Provider  acetaminophen (TYLENOL) 325 MG tablet Take 325-650 mg by mouth 2 (two) times daily as needed for moderate pain or headache. Reported on 05/14/2016    [provider]  ibuprofen (ADVIL) 600 MG tablet Take 1 tablet (600 mg total) by mouth every 6 (six) hours as needed. 04/05/19   Dahlia Byes A, NP  mirtazapine (REMERON) 7.5 MG tablet Take 1 tablet (7.5 mg total) by mouth at bedtime. 06/09/20    Georgetta Haber, NP  omeprazole (PRILOSEC) 20 MG capsule Take 1 capsule (20 mg total) by mouth daily. 12/12/18   Dorene Sorrow, MD    Family History Family History  Problem Relation Age of Onset   Diabetes Father    Hypertension Father     Social History Social History   Tobacco Use   Smoking status: Never Smoker   Smokeless tobacco: Never Used  Substance Use Topics   Alcohol use: Yes    Alcohol/week: 2.0 standard drinks    Types: 2 Glasses of wine per week   Drug use: Never     Allergies   Lamotrigine   Review of Systems Review of Systems   Physical Exam Triage Vital Signs ED Triage Vitals  Enc Vitals Group     BP 06/09/20 1328 116/73     Pulse Rate 06/09/20 1328 99     Resp 06/09/20 1328 18     Temp 06/09/20 1328 98.5 F (36.9 C)     Temp Source 06/09/20 1328 Oral     SpO2 06/09/20 1328 99 %     Weight 06/09/20 1329 93 lb (42.2 kg)     Height 06/09/20 1329 5\' 4"  (1.626 m)     Head Circumference --      Peak Flow --      Pain Score 06/09/20 1329 0  Pain Loc --      Pain Edu? --      Excl. in GC? --    No data found.  Updated Vital Signs BP 116/73    Pulse 99    Temp 98.5 F (36.9 C) (Oral)    Resp 18    Ht 5\' 4"  (1.626 m)    Wt 93 lb (42.2 kg)    SpO2 99%    BMI 15.96 kg/m       Physical Exam Constitutional:      General: Amanda Conley is not in acute distress.    Appearance: Amanda Conley is well-developed.  Cardiovascular:     Rate and Rhythm: Normal rate.  Pulmonary:     Effort: Pulmonary effort is normal.  Skin:    General: Skin is warm and dry.  Neurological:     General: No focal deficit present.     Mental Status: Amanda Conley is alert and oriented to person, place, and time. Mental status is at baseline.  Psychiatric:        Mood and Affect: Mood normal.        Behavior: Behavior normal.        Thought Content: Thought content normal.      UC Treatments / Results  Labs (all labs ordered are listed, but only abnormal  results are displayed) Labs Reviewed - No data to display  EKG   Radiology No results found.  Procedures Procedures (including critical care time)  Medications Ordered in UC Medications - No data to display  Initial Impression / Assessment and Plan / UC Course  I have reviewed the triage vital signs and the nursing notes.  Pertinent labs & imaging results that were available during my care of the patient were reviewed by me and considered in my medical decision making (see chart for details).     remeron 7.5mg  once daily at night provided for 30 day supply, with emphasis to establish with a PCP for recheck and management long term. Patient verbalized understanding and agreeable to plan.   Final Clinical Impressions(s) / UC Diagnoses   Final diagnoses:  Anxiety     Discharge Instructions     I have refilled your remeron for 30 days. I would like you to call to establish care with a primary care provider for long term management of this medication, as it also should not be abruptly stopped.     ED Prescriptions    Medication Sig Dispense Auth. Provider   mirtazapine (REMERON) 7.5 MG tablet Take 1 tablet (7.5 mg total) by mouth at bedtime. 30 tablet Kendrick Ranch, NP     PDMP not reviewed this encounter.   Georgetta Haber, NP 06/09/20 1520

## 2020-06-09 NOTE — Discharge Instructions (Addendum)
I have refilled your remeron for 30 days. I would like you to call to establish care with a primary care provider for long term management of this medication, as it also should not be abruptly stopped.

## 2020-06-09 NOTE — ED Triage Notes (Addendum)
Appt. Pt states her anxiety has been getting worse over the past month. PT states she has hx of anxiety and stopped taking meds a yr ago.

## 2022-04-01 ENCOUNTER — Encounter: Payer: No Typology Code available for payment source | Admitting: Radiology

## 2023-07-27 DIAGNOSIS — F411 Generalized anxiety disorder: Secondary | ICD-10-CM | POA: Diagnosis not present

## 2023-07-27 DIAGNOSIS — F609 Personality disorder, unspecified: Secondary | ICD-10-CM | POA: Diagnosis not present

## 2023-07-27 DIAGNOSIS — F4323 Adjustment disorder with mixed anxiety and depressed mood: Secondary | ICD-10-CM | POA: Diagnosis not present

## 2023-08-03 DIAGNOSIS — F603 Borderline personality disorder: Secondary | ICD-10-CM | POA: Diagnosis not present

## 2023-08-03 DIAGNOSIS — F902 Attention-deficit hyperactivity disorder, combined type: Secondary | ICD-10-CM | POA: Diagnosis not present

## 2023-08-03 DIAGNOSIS — F3181 Bipolar II disorder: Secondary | ICD-10-CM | POA: Diagnosis not present

## 2023-08-03 DIAGNOSIS — F411 Generalized anxiety disorder: Secondary | ICD-10-CM | POA: Diagnosis not present

## 2023-08-05 DIAGNOSIS — F609 Personality disorder, unspecified: Secondary | ICD-10-CM | POA: Diagnosis not present

## 2023-08-05 DIAGNOSIS — F4323 Adjustment disorder with mixed anxiety and depressed mood: Secondary | ICD-10-CM | POA: Diagnosis not present

## 2023-08-05 DIAGNOSIS — F411 Generalized anxiety disorder: Secondary | ICD-10-CM | POA: Diagnosis not present

## 2023-09-20 ENCOUNTER — Encounter: Payer: Self-pay | Admitting: Infectious Diseases

## 2023-09-20 ENCOUNTER — Other Ambulatory Visit: Payer: Self-pay | Admitting: Infectious Diseases

## 2023-09-20 ENCOUNTER — Ambulatory Visit
Admission: RE | Admit: 2023-09-20 | Discharge: 2023-09-20 | Disposition: A | Discharge status: Home / Self Care | Payer: No Typology Code available for payment source | Source: Ambulatory Visit | Attending: Infectious Diseases | Admitting: Infectious Diseases

## 2023-09-20 DIAGNOSIS — R7611 Nonspecific reaction to tuberculin skin test without active tuberculosis: Secondary | ICD-10-CM

## 2023-09-20 DIAGNOSIS — R7612 Nonspecific reaction to cell mediated immunity measurement of gamma interferon antigen response without active tuberculosis: Secondary | ICD-10-CM | POA: Diagnosis not present

## 2023-09-21 DIAGNOSIS — Z111 Encounter for screening for respiratory tuberculosis: Secondary | ICD-10-CM | POA: Diagnosis not present

## 2023-10-11 DIAGNOSIS — Z30433 Encounter for removal and reinsertion of intrauterine contraceptive device: Secondary | ICD-10-CM | POA: Diagnosis not present

## 2024-01-06 DIAGNOSIS — F411 Generalized anxiety disorder: Secondary | ICD-10-CM | POA: Diagnosis not present

## 2024-01-06 DIAGNOSIS — F603 Borderline personality disorder: Secondary | ICD-10-CM | POA: Diagnosis not present

## 2024-01-06 DIAGNOSIS — F3181 Bipolar II disorder: Secondary | ICD-10-CM | POA: Diagnosis not present

## 2024-01-11 ENCOUNTER — Ambulatory Visit: Payer: 59 | Admitting: Urgent Care

## 2024-01-11 ENCOUNTER — Ambulatory Visit: Payer: 59 | Admitting: Family Medicine

## 2024-01-26 DIAGNOSIS — K529 Noninfective gastroenteritis and colitis, unspecified: Secondary | ICD-10-CM | POA: Diagnosis not present

## 2024-02-01 DIAGNOSIS — H5213 Myopia, bilateral: Secondary | ICD-10-CM | POA: Diagnosis not present

## 2024-02-01 DIAGNOSIS — H52223 Regular astigmatism, bilateral: Secondary | ICD-10-CM | POA: Diagnosis not present

## 2024-02-24 ENCOUNTER — Ambulatory Visit (HOSPITAL_BASED_OUTPATIENT_CLINIC_OR_DEPARTMENT_OTHER): Payer: 59 | Admitting: Family Medicine

## 2024-04-11 ENCOUNTER — Other Ambulatory Visit (HOSPITAL_BASED_OUTPATIENT_CLINIC_OR_DEPARTMENT_OTHER): Payer: Self-pay | Admitting: Family Medicine

## 2024-04-11 ENCOUNTER — Ambulatory Visit (HOSPITAL_BASED_OUTPATIENT_CLINIC_OR_DEPARTMENT_OTHER): Admitting: Family Medicine

## 2024-04-11 ENCOUNTER — Encounter (HOSPITAL_BASED_OUTPATIENT_CLINIC_OR_DEPARTMENT_OTHER): Payer: Self-pay | Admitting: Family Medicine

## 2024-04-11 ENCOUNTER — Encounter (HOSPITAL_BASED_OUTPATIENT_CLINIC_OR_DEPARTMENT_OTHER): Payer: Self-pay | Admitting: *Deleted

## 2024-04-11 VITALS — BP 122/85 | HR 85 | Ht 64.0 in | Wt 102.4 lb

## 2024-04-11 DIAGNOSIS — H6991 Unspecified Eustachian tube disorder, right ear: Secondary | ICD-10-CM

## 2024-04-11 DIAGNOSIS — F419 Anxiety disorder, unspecified: Secondary | ICD-10-CM

## 2024-04-11 DIAGNOSIS — Z13220 Encounter for screening for lipoid disorders: Secondary | ICD-10-CM

## 2024-04-11 DIAGNOSIS — Z0000 Encounter for general adult medical examination without abnormal findings: Secondary | ICD-10-CM

## 2024-04-11 DIAGNOSIS — F32A Depression, unspecified: Secondary | ICD-10-CM | POA: Insufficient documentation

## 2024-04-11 NOTE — Progress Notes (Signed)
 New Patient Office Visit  Subjective:   Amanda Conley Mar 14, 1996 04/11/2024  Chief Complaint  Patient presents with   New Patient (Initial Visit)    Patient is here today to get established with the practice. Stated that her psychiatrist told her that she needed to get established with a PCP. Also states she has been having some problems with her right ear.    HPI: Amanda Conley presents today to establish care at [facility] at [facility]. Introduced to Publishing rights manager role and practice setting.  All questions answered.   ANXIETY AND DEPRESSION:  Patient sees Anne Dibala as her Psychiatrist. She was previously on medication (Abilify and Fluoxetine ) for anxiety and depression but had a reaction including fatigue, shortness of breath, syncope after taking her both medications approx. 1 hour after ingestion. She states she took both medications for the first time restarting them together when symptoms occurred. Patient states she is supposed to retry her medication for 1 month prior to returning to Psychiatry. She states she previously tolerated fluoxetine  and abilify without difficulty.  Psychiatrist recommended she be seen for physical exam first.      04/11/2024    2:27 PM 01/02/2018    3:52 PM 07/22/2017    2:47 PM  GAD 7 : Generalized Anxiety Score  Nervous, Anxious, on Edge 3 0 0  Control/stop worrying 2 0 0  Worry too much - different things 1 0 0  Trouble relaxing 1 0 0  Restless 1 0 0  Easily annoyed or irritable 0 0 1  Afraid - awful might happen 1 0 0  Total GAD 7 Score 9 0 1  Anxiety Difficulty Not difficult at all         04/11/2024    2:26 PM 01/02/2018    3:52 PM 07/22/2017    2:47 PM 08/25/2015    8:26 PM  Depression screen PHQ 2/9  Decreased Interest 1 0 0 2  Down, Depressed, Hopeless 1 0 0 3  PHQ - 2 Score 2 0 0 5  Altered sleeping 3 1 3 3   Tired, decreased energy 1 0 2 3  Change in appetite 0 0 0 0  Feeling bad or  failure about yourself  2 0 0 2  Trouble concentrating 1 0 0 2  Moving slowly or fidgety/restless 0 0 0 0  Suicidal thoughts 1   2  PHQ-9 Score 10 1 5 17   Difficult doing work/chores Not difficult at all        OTALGIA: Onset:   Location: Right Ear  - she states the right ear feels more "full". She states the sensation worsens with putting on headphones.   Sensation of fullness:  yes Ear discharge:  no URI symptoms:  no Fever:   no Tinnitus: no Dizziness:  no Hearing loss:  no Headache:  no Toothache:  no Rash:  no  Treatments tried: None  Red Flags Recent trauma:  no PMH recurrent OM:  no PMH prior ear surgery: no Tubes currently:  no Recent antibiotic usage (last 30 days): no Diabetes or Immunosuppresion:   no  The following portions of the patient's history were reviewed and updated as appropriate: past medical history, past surgical history, family history, social history, allergies, medications, and problem list.   Patient Active Problem List   Diagnosis Date Noted   Anxiety and depression 04/11/2024   Dysfunction of right eustachian tube 04/11/2024   IUD (intrauterine device) in place 03/10/2018  Adjustment disorder with mixed anxiety and depressed mood 02/19/2016   Eating disorder    Malnutrition compromising bodily function [facility])    Past Medical History:  Diagnosis Date   Anxiety    Depression    History reviewed. No pertinent surgical history. Family History  Problem Relation Age of Onset   Diabetes Father    Hypertension Father    Social History   Socioeconomic History   Marital status: Single    Spouse name: Not on file   Number of children: Not on file   Years of education: Not on file   Highest education level: Professional school degree (e.g., MD, DDS, DVM, JD)  Occupational History   Not on file  Tobacco Use   Smoking status: Never   Smokeless tobacco: Never  Substance and Sexual Activity   Alcohol use: Not Currently     Alcohol/week: 2.0 standard drinks of alcohol   Drug use: Never   Sexual activity: Yes    Birth control/protection: Condom, I.U.D.  Other Topics Concern   Not on file  Social History Narrative   Lives at home with mother and father and older brother. No pets. Father smokes cigars in the home.    Social Drivers of Corporate investment banker Strain: Low Risk  (01/05/2024)   Overall Financial Resource Strain (CARDIA)    Difficulty of Paying Living Expenses: Not hard at all  Food Insecurity: No Food Insecurity (01/05/2024)   Hunger Vital Sign    Worried About Running Out of Food in the Last Year: Never true    Ran Out of Food in the Last Year: Never true  Transportation Needs: No Transportation Needs (01/05/2024)   PRAPARE - Administrator, Civil Service (Medical): No    Lack of Transportation (Non-Medical): No  Physical Activity: Insufficiently Active (01/05/2024)   Exercise Vital Sign    Days of Exercise per Week: 2 days    Minutes of Exercise per Session: 50 min  Stress: Stress Concern Present (01/05/2024)   [employer] of Occupational Health - Occupational Stress Questionnaire    Feeling of Stress : Very much  Social Connections: Socially Isolated (01/05/2024)   Social Connection and Isolation Panel [NHANES]    Frequency of Communication with Friends and Family: More than three times a week    Frequency of Social Gatherings with Friends and Family: Once a week    Attends Religious Services: Never    Database administrator or Organizations: No    Attends Engineer, structural: Not on file    Marital Status: Never married  Intimate Partner Violence: Not on file   Outpatient Medications Prior to Visit  Medication Sig Dispense Refill   acetaminophen  (TYLENOL ) 325 MG tablet Take 325-650 mg by mouth 2 (two) times daily as needed for moderate pain or headache. Reported on 05/14/2016     ibuprofen  (ADVIL ) 600 MG tablet Take 1 tablet (600 mg total) by mouth every 6  (six) hours as needed. 30 tablet 0   levonorgestrel  (MIRENA ) 20 MCG/DAY IUD 1 each by Intrauterine route once.     mirtazapine  (REMERON ) 7.5 MG tablet Take 1 tablet (7.5 mg total) by mouth at bedtime. 30 tablet 0   omeprazole  (PRILOSEC) 20 MG capsule Take 1 capsule (20 mg total) by mouth daily. 30 capsule 0   No facility-administered medications prior to visit.   Allergies  Allergen Reactions   Lamotrigine  Rash    ROS: A complete ROS was performed with pertinent  positives/negatives noted in the HPI. The remainder of the ROS are negative.   Objective:   Today's Vitals   04/11/24 1421  BP: 122/85  Pulse: 85  SpO2: 100%  Weight: 102 lb 6.4 oz (46.4 kg)  Height: 5\' 4"  (1.626 m)    GENERAL: Well-appearing, in NAD. Well nourished.  SKIN: Pink, warm and dry. No rash, lesion, ulceration, or ecchymoses.  Head: Normocephalic. NECK: Trachea midline. Full ROM w/o pain or tenderness.  EARS: Tympanic membranes are intact, translucent without bulging and without drainage. Appropriate landmarks visualized.  EYES: Conjunctiva clear without exudates. EOMI, PERRL, no drainage present.  NOSE: Septum midline w/o deformity. Nares patent, mucosa pink and non-inflamed w/o drainage. No sinus tenderness.  THROAT: Uvula midline. Oropharynx clear.Mucous membranes pink and moist.  RESPIRATORY: Chest wall symmetrical. Respirations even and non-labored.  MSK: Muscle tone and strength appropriate for age.  NEUROLOGIC: No motor or sensory deficits. Steady, even gait. C2-C12 intact.  PSYCH/MENTAL STATUS: Alert, oriented x 3. Cooperative, appropriate mood and affect.     Assessment & Plan:  1. Dysfunction of right eustachian tube (Primary) Possibly due to barometric pressure or environmental allergens. Recommend patient start using Zyrtec or Claritin and add Flonase if needed. If no improvement within 2-3 weeks or symptoms worsening, reach out to PCP.   2. Anxiety and depression Possible adverse effect due  to serotonin effect with restarting both medications. We discussed titrated scheduled of medications and to schedule follow up with Psychiatry. Discussed local resources and safety plan reviewed. See medication titration instructions below:   Fluoxetine  10mg  dosing :  Start taking 1 capsule daily ALONE for at least 2-3 weeks.   THEN (if needed):  Start Abilify 1/2 (half) tablet daily for at least 2-3 weeks before increasing to a whole tablet if needed.     Patient to reach out to office if new, worrisome, or unresolved symptoms arise or if no improvement in patient's condition. Patient verbalized understanding and is agreeable to treatment plan. All questions answered to patient's satisfaction.    Return in about 2 months (around 06/11/2024) for ANNUAL PHYSICAL (fasting labs prior) .    Nonda Bays, [place]

## 2024-04-11 NOTE — Patient Instructions (Signed)
 Fluoxetine  10mg  dosing :  Start taking 1 capsule daily ALONE for at least 2-3 weeks.   THEN (if needed):  Start Abilify 1/2 (half) tablet daily for at least 2-3 weeks before increasing to a whole tablet if needed.

## 2024-05-24 DIAGNOSIS — F3181 Bipolar II disorder: Secondary | ICD-10-CM | POA: Diagnosis not present

## 2024-05-24 DIAGNOSIS — F411 Generalized anxiety disorder: Secondary | ICD-10-CM | POA: Diagnosis not present

## 2024-06-01 DIAGNOSIS — F3181 Bipolar II disorder: Secondary | ICD-10-CM | POA: Diagnosis not present

## 2024-06-01 DIAGNOSIS — F411 Generalized anxiety disorder: Secondary | ICD-10-CM | POA: Diagnosis not present

## 2024-06-06 DIAGNOSIS — F411 Generalized anxiety disorder: Secondary | ICD-10-CM | POA: Diagnosis not present

## 2024-06-13 DIAGNOSIS — F411 Generalized anxiety disorder: Secondary | ICD-10-CM | POA: Diagnosis not present

## 2024-07-03 ENCOUNTER — Encounter (HOSPITAL_BASED_OUTPATIENT_CLINIC_OR_DEPARTMENT_OTHER): Admitting: Family Medicine

## 2024-08-23 DIAGNOSIS — F3181 Bipolar II disorder: Secondary | ICD-10-CM | POA: Diagnosis not present

## 2024-08-23 DIAGNOSIS — F411 Generalized anxiety disorder: Secondary | ICD-10-CM | POA: Diagnosis not present

## 2024-08-23 DIAGNOSIS — F4323 Adjustment disorder with mixed anxiety and depressed mood: Secondary | ICD-10-CM | POA: Diagnosis not present

## 2024-10-04 ENCOUNTER — Encounter (HOSPITAL_BASED_OUTPATIENT_CLINIC_OR_DEPARTMENT_OTHER): Admitting: Family Medicine
